# Patient Record
Sex: Female | Born: 1950 | Race: Black or African American | Hispanic: No | Marital: Single | Smoking: Current every day smoker
Health system: Southern US, Community
[De-identification: ages and names within clinical notes are randomized; demographics above are authoritative.]

## PROBLEM LIST (undated history)

## (undated) DIAGNOSIS — H409 Unspecified glaucoma: Secondary | ICD-10-CM

## (undated) DIAGNOSIS — J302 Other seasonal allergic rhinitis: Secondary | ICD-10-CM

## (undated) DIAGNOSIS — E785 Hyperlipidemia, unspecified: Secondary | ICD-10-CM

## (undated) DIAGNOSIS — E7800 Pure hypercholesterolemia, unspecified: Secondary | ICD-10-CM

## (undated) HISTORY — DX: Unspecified glaucoma: H40.9

## (undated) HISTORY — DX: Hyperlipidemia, unspecified: E78.5

## (undated) HISTORY — DX: Other seasonal allergic rhinitis: J30.2

## (undated) HISTORY — PX: TOOTH EXTRACTION: SUR596

---

## 2007-01-02 ENCOUNTER — Ambulatory Visit (HOSPITAL_COMMUNITY): Admission: RE | Admit: 2007-01-02 | Discharge: 2007-01-02 | Payer: Self-pay | Admitting: Family Medicine

## 2008-03-01 ENCOUNTER — Emergency Department (HOSPITAL_COMMUNITY): Admission: EM | Admit: 2008-03-01 | Discharge: 2008-03-01 | Payer: Self-pay | Admitting: Emergency Medicine

## 2010-12-30 ENCOUNTER — Encounter: Payer: Self-pay | Admitting: *Deleted

## 2010-12-30 ENCOUNTER — Emergency Department (HOSPITAL_COMMUNITY): Payer: BC Managed Care – PPO

## 2010-12-30 ENCOUNTER — Other Ambulatory Visit: Payer: Self-pay

## 2010-12-30 ENCOUNTER — Emergency Department (HOSPITAL_COMMUNITY)
Admission: EM | Admit: 2010-12-30 | Discharge: 2010-12-30 | Disposition: A | Discharge status: Home / Self Care | Payer: BC Managed Care – PPO | Attending: Emergency Medicine | Admitting: Emergency Medicine

## 2010-12-30 DIAGNOSIS — R51 Headache: Secondary | ICD-10-CM | POA: Insufficient documentation

## 2010-12-30 DIAGNOSIS — H8110 Benign paroxysmal vertigo, unspecified ear: Secondary | ICD-10-CM | POA: Insufficient documentation

## 2010-12-30 DIAGNOSIS — F17200 Nicotine dependence, unspecified, uncomplicated: Secondary | ICD-10-CM | POA: Insufficient documentation

## 2010-12-30 DIAGNOSIS — K029 Dental caries, unspecified: Secondary | ICD-10-CM | POA: Insufficient documentation

## 2010-12-30 DIAGNOSIS — R55 Syncope and collapse: Secondary | ICD-10-CM | POA: Insufficient documentation

## 2010-12-30 LAB — CBC
Hemoglobin: 15 g/dL (ref 12.0–15.0)
MCH: 31.7 pg (ref 26.0–34.0)
MCHC: 33.6 g/dL (ref 30.0–36.0)
RBC: 4.73 MIL/uL (ref 3.87–5.11)
RDW: 12.4 % (ref 11.5–15.5)

## 2010-12-30 LAB — COMPREHENSIVE METABOLIC PANEL
BUN: 9 mg/dL (ref 6–23)
Chloride: 104 mEq/L (ref 96–112)
Creatinine, Ser: 0.62 mg/dL (ref 0.50–1.10)
GFR calc Af Amer: >60 mL/min (ref >60)
GFR calc non Af Amer: >60 mL/min (ref >60)
Glucose, Bld: 87 mg/dL (ref 70–99)
Potassium: 3.6 mEq/L (ref 3.5–5.1)
Sodium: 137 mEq/L (ref 135–145)
Total Protein: 6.2 g/dL (ref 6.0–8.3)

## 2010-12-30 LAB — POCT I-STAT TROPONIN I: Troponin i, poc: 0 ng/mL (ref 0.00–0.08)

## 2010-12-30 MED ORDER — MECLIZINE HCL 12.5 MG PO TABS
25.0000 mg | ORAL_TABLET | Freq: Once | ORAL | Status: AC
  Administered 2010-12-30: 25 mg via ORAL
  Filled 2010-12-30: qty 2

## 2010-12-30 MED ORDER — SODIUM CHLORIDE 0.9 % IV SOLN
INTRAVENOUS | Status: DC
  Administered 2010-12-30: 20:00:00 via INTRAVENOUS

## 2010-12-30 MED ORDER — METOCLOPRAMIDE HCL 5 MG/ML IJ SOLN
10.0000 mg | Freq: Once | INTRAMUSCULAR | Status: AC
  Administered 2010-12-30: 10 mg via INTRAVENOUS
  Filled 2010-12-30: qty 2

## 2010-12-30 MED ORDER — MECLIZINE HCL 25 MG PO TABS: 25.0000 mg | ORAL_TABLET | Freq: Four times a day (QID) | ORAL | Status: AC

## 2010-12-30 MED ORDER — PROMETHAZINE HCL 25 MG PO TABS: 25.0000 mg | ORAL_TABLET | Freq: Four times a day (QID) | ORAL | Status: AC | PRN

## 2010-12-30 MED ORDER — DIPHENHYDRAMINE HCL 50 MG/ML IJ SOLN
25.0000 mg | Freq: Once | INTRAMUSCULAR | Status: AC
  Administered 2010-12-30: 25 mg via INTRAVENOUS
  Filled 2010-12-30: qty 1

## 2010-12-30 NOTE — ED Notes (Signed)
Patient states went outside to eat lunch yesterday and was eating and states she felt like the table was moving; however, her co-workers informed that she was moving.  Patient states that her legs were numb yesterday and that she woke up this morning with an all-over headache.  Patient ambulatory with steady gait, speech clear; A&O.

## 2010-12-30 NOTE — ED Provider Notes (Cosign Needed)
History     CSN: [id number] Arrival date & time: 12/30/2010  7:15 PM  Chief Complaint  Patient presents with  . Headache  . numbness in legs yesterday    Patient is a 60 y.o. female presenting with headaches.  Headache   Pt relates yesterday at work she was sitting at a table and felt like the table was spinning and her coworkers states she leaned over and passed out for a few seconds. She had difficulty walking because both legs were rubbery. Has a mild right sided headache at that time. She was sent home from work and was able to walk. States she still feels like her eyes are moving and they aren't. She has a holocranial headache today that is constant and steady, not throbbing. Denies nausea or vomiting, states she had blurred vision yesterday. Denies numbness in her extremities, denies chest pain or shortness of breath. States her headache was an 8 and is now a 5 from sitting in the waiting room so long.  States she doesn't have a hx of headaches and this has never happened before. States her grandson just left the house who was living with her to go to college and she has no one to fuss out but the dog.  History reviewed. No pertinent past medical history.  History reviewed. No pertinent past surgical history.  No family history on file. Denies hx of stroke or headaches  History  Substance Use Topics  . Smoking status: Current Everyday Smoker -- 1.0 packs/day  . Smokeless tobacco: Not on file  . Alcohol Use: Yes     occasionally  employed  OB History    Grav Para Term Preterm Abortions TAB SAB Ect Mult Living                  Review of Systems  Neurological: Positive for headaches.  All other systems reviewed and are negative.    Physical Exam  BP 122/83  Pulse 70  Temp(Src) 98.8 F (37.1 C) (Oral)  Resp 18  Ht 5\' 10"  (1.778 m)  Wt 130 lb (58.968 kg)  BMI 18.65 kg/m2  SpO2 100%  Physical Exam  Constitutional: She appears well-developed and well-nourished.    HENT:  Head: Normocephalic and atraumatic.  Mouth/Throat: Mucous membranes are normal. Abnormal dentition. Dental caries present. Posterior oropharyngeal erythema present. No oropharyngeal exudate, posterior oropharyngeal edema or tonsillar abscesses.  Eyes: EOM are normal. Pupils are equal, round, and reactive to light. No foreign bodies found. Right eye exhibits no nystagmus.  Neck: Normal range of motion.  Cardiovascular: Normal rate, regular rhythm and normal heart sounds.   Pulmonary/Chest: Effort normal and breath sounds normal.  Musculoskeletal:       Pt has no acute abnormality.   Neurological: She is alert. She has normal strength. No cranial nerve deficit or sensory deficit. She displays a negative Romberg sign.  Reflex Scores:      Patellar reflexes are 2+ on the right side and 2+ on the left side.      FTF is intact  Skin: Skin is warm and dry. No abrasion and no rash noted.  Psychiatric: She has a normal mood and affect. Her speech is normal and behavior is normal. Thought content normal.    ED Course  Procedures  MDM   Date: 09/01/20122  Rate: 65  Rhythm: normal sinus rhythm  QRS Axis: normal  Intervals: normal  ST/T Wave abnormalities: normal  Conduction Disutrbances:none  Narrative Interpretation:   Old EKG  Reviewed: none available  Results for orders placed during the hospital encounter of 12/30/10  CBC      Component Value Range   WBC 5.9  4.0 - 10.5 (K/uL)   RBC 4.73  3.87 - 5.11 (MIL/uL)   Hemoglobin 15.0  12.0 - 15.0 (g/dL)   HCT 16.1  09.6 - 04.5 (%)   MCV 94.3  78.0 - 100.0 (fL)   MCH 31.7  26.0 - 34.0 (pg)   MCHC 33.6  30.0 - 36.0 (g/dL)   RDW 40.9  81.1 - 91.4 (%)   Platelets 219  150 - 400 (K/uL)  COMPREHENSIVE METABOLIC PANEL      Component Value Range   Sodium 137  135 - 145 (mEq/L)   Potassium 3.6  3.5 - 5.1 (mEq/L)   Chloride 104  96 - 112 (mEq/L)   CO2 28  19 - 32 (mEq/L)   Glucose, Bld 87  70 - 99 (mg/dL)   BUN 9  6 - 23 (mg/dL)    Creatinine, Ser 7.82  0.50 - 1.10 (mg/dL)   Calcium 8.6  8.4 - 95.6 (mg/dL)   Total Protein 6.2  6.0 - 8.3 (g/dL)   Albumin 3.5  3.5 - 5.2 (g/dL)   AST 8  0 - 37 (U/L)   ALT 7  0 - 35 (U/L)   Alkaline Phosphatase 68  39 - 117 (U/L)   Total Bilirubin 0.4  0.3 - 1.2 (mg/dL)   GFR calc non Af Amer >60  >60 (mL/min)   GFR calc Af Amer >60  >60 (mL/min)  POCT I-STAT TROPONIN I      Component Value Range   Troponin i, poc 0.00  0.00 - 0.08 (ng/mL)   Comment 3           All normal  Ct Head Wo Contrast  12/30/2010  *RADIOLOGY REPORT*  Clinical Data: Headache, dizziness  CT HEAD WITHOUT CONTRAST  Technique:  Contiguous axial images were obtained from the base of the skull through the vertex without contrast.  Comparison: None.  Findings: No acute intracranial hemorrhage.  No focal mass lesion. No CT evidence of acute infarction.  No midline shift or mass effect.  No hydrocephalus.  Basilar cisterns are patent.  Paranasal sinuses and mastoid air cells are clear.  Orbits are normal.  IMPRESSION: No acute intracranial findings.  Original Report Authenticated By: Genevive Bi, M.D.     Pt given antivert, reglan and benadryl in the ED. States her headache is gone and she feels good ready to go home.  Ward Givens, MD 12/30/10 2232

## 2010-12-30 NOTE — ED Notes (Signed)
Pt c/o numbness in legs while at her lunch break yesterday at work. Pt states she was sitting at the table and fell over on the person beside her. Pt states she has a headache today and has never had headache before. Pt also c/o pain to teeth.

## 2012-02-01 ENCOUNTER — Other Ambulatory Visit (HOSPITAL_COMMUNITY): Payer: Self-pay | Admitting: Family Medicine

## 2012-02-01 DIAGNOSIS — Z139 Encounter for screening, unspecified: Secondary | ICD-10-CM

## 2012-02-11 ENCOUNTER — Ambulatory Visit (HOSPITAL_COMMUNITY)
Admission: RE | Admit: 2012-02-11 | Discharge: 2012-02-11 | Disposition: A | Discharge status: Home / Self Care | Payer: BC Managed Care – PPO | Source: Ambulatory Visit | Attending: Family Medicine | Admitting: Family Medicine

## 2012-02-11 DIAGNOSIS — Z139 Encounter for screening, unspecified: Secondary | ICD-10-CM

## 2012-02-11 DIAGNOSIS — Z1231 Encounter for screening mammogram for malignant neoplasm of breast: Secondary | ICD-10-CM | POA: Insufficient documentation

## 2012-02-28 ENCOUNTER — Encounter (INDEPENDENT_AMBULATORY_CARE_PROVIDER_SITE_OTHER): Payer: Self-pay | Admitting: *Deleted

## 2012-03-24 ENCOUNTER — Encounter (INDEPENDENT_AMBULATORY_CARE_PROVIDER_SITE_OTHER): Payer: Self-pay | Admitting: *Deleted

## 2012-07-10 ENCOUNTER — Telehealth: Payer: Self-pay | Admitting: Gastroenterology

## 2012-07-10 ENCOUNTER — Telehealth: Payer: Self-pay | Admitting: *Deleted

## 2012-07-10 NOTE — Telephone Encounter (Signed)
[name], Cindy Buckley was seen in the office today and has referred her sister to Dr Darrick Penna for a colonoscopy.  She would like to be triaged as opposed to having an office visit. Please call [name] on her cell phone to set up her procedure. Thank you.

## 2012-07-10 NOTE — Telephone Encounter (Signed)
Forwarding to [facility] to Triage

## 2012-07-10 NOTE — Telephone Encounter (Addendum)
Please call pt to schedule screening TCS. Asked her sister: Gardiner Ramus to ask [place] to call her to schedule. NEEDS AN APPT ON A FRI.

## 2012-07-11 NOTE — Telephone Encounter (Signed)
See separate phone note of 07/10/2012.

## 2012-07-11 NOTE — Telephone Encounter (Signed)
Called, many rings and no answer.

## 2012-07-11 NOTE — Telephone Encounter (Signed)
Called. Was told to call her after 4:00 pm one day because of her work schedule.

## 2012-07-15 ENCOUNTER — Other Ambulatory Visit: Payer: Self-pay

## 2012-07-15 DIAGNOSIS — Z1211 Encounter for screening for malignant neoplasm of colon: Secondary | ICD-10-CM

## 2012-07-16 ENCOUNTER — Telehealth: Payer: Self-pay

## 2012-07-16 NOTE — Telephone Encounter (Signed)
Gastroenterology Pre-Procedure Form    Request Date: 07/15/2012     Requesting Physician: Dr. Darrick Penna     PATIENT INFORMATION:  Cindy Buckley is a 62 y.o., female (DOB=December 10, 1950).  PROCEDURE: Procedure(s) requested: colonoscopy Procedure Reason: screening for colon cancer  PATIENT REVIEW QUESTIONS: The patient reports the following:   1. Diabetes Melitis: no 2. Joint replacements in the past 12 months: no 3. Major health problems in the past 3 months: no 4. Has an artificial valve or MVP:no 5. Has been advised in past to take antibiotics in advance of a procedure like teeth cleaning: no}    MEDICATIONS & ALLERGIES:    Patient reports the following regarding taking any blood thinners:   Plavix? no Aspirin?no Coumadin?  no  Patient confirms/reports the following medications:  Current Outpatient Prescriptions  Medication Sig Dispense Refill  . NON FORMULARY Allegra  OTC      . pravastatin (PRAVACHOL) 40 MG tablet Take 40 mg by mouth daily.       No current facility-administered medications for this visit.    Patient confirms/reports the following allergies:  No Known Allergies  Patient is appropriate to schedule for requested procedure(s): yes  AUTHORIZATION INFORMATION Primary Insurance:   ID #: Group #:  Pre-Cert / Auth required: Pre-Cert / Auth #:   Secondary Insurance:   ID #:  Group #:  Pre-Cert / Auth required: Pre-Cert / Auth #:   No orders of the defined types were placed in this encounter.    SCHEDULE INFORMATION: Procedure has been scheduled as follows:  Date: 07/25/2012   Time: 11:30 AM  Location: [facility] Short Stay  This Gastroenterology Pre-Precedure Form is being routed to the following provider(s) for review: Jonette Eva, MD

## 2012-07-16 NOTE — Telephone Encounter (Signed)
See separate triage.  

## 2012-07-22 MED ORDER — SOD PICOSULFATE-MAG OX-CIT ACD 10-3.5-12 MG-GM-GM PO PACK: 1.0000 | PACK | Freq: Once | ORAL | Status: DC

## 2012-07-22 NOTE — Telephone Encounter (Signed)
Called pharmacy and confirmed the prescription and instructions were received. Pt's sister, Tonna Corner, is aware.

## 2012-07-22 NOTE — Telephone Encounter (Signed)
Rx sent to [facility] and instructions will be faxed to [facility] since pt is scheduled for 07/25/2012.

## 2012-07-22 NOTE — Telephone Encounter (Signed)
PREPOPIK-DRINK WATER TO KEEP URINE LIGHT YELLOW.  PT SHOULD DROP OFF RX 3 DAYS PRIOR TO PROCEDURE.  

## 2012-07-24 ENCOUNTER — Encounter (HOSPITAL_COMMUNITY): Payer: Self-pay | Admitting: Pharmacy Technician

## 2012-07-24 MED ORDER — SODIUM CHLORIDE 0.45 % IV SOLN: INTRAVENOUS | Status: DC

## 2012-07-25 ENCOUNTER — Ambulatory Visit (HOSPITAL_COMMUNITY)
Admission: RE | Admit: 2012-07-25 | Discharge: 2012-07-25 | Disposition: A | Discharge status: Home Health | Payer: BC Managed Care – PPO | Source: Ambulatory Visit | Attending: Gastroenterology | Admitting: Gastroenterology

## 2012-07-25 ENCOUNTER — Encounter (HOSPITAL_COMMUNITY)
Admission: RE | Disposition: A | Discharge status: Home Health | Payer: Self-pay | Source: Ambulatory Visit | Attending: Gastroenterology

## 2012-07-25 ENCOUNTER — Encounter (HOSPITAL_COMMUNITY): Payer: Self-pay

## 2012-07-25 DIAGNOSIS — D126 Benign neoplasm of colon, unspecified: Secondary | ICD-10-CM | POA: Insufficient documentation

## 2012-07-25 DIAGNOSIS — Z1211 Encounter for screening for malignant neoplasm of colon: Secondary | ICD-10-CM | POA: Insufficient documentation

## 2012-07-25 DIAGNOSIS — K648 Other hemorrhoids: Secondary | ICD-10-CM

## 2012-07-25 HISTORY — PX: COLONOSCOPY: SHX5424

## 2012-07-25 HISTORY — DX: Pure hypercholesterolemia, unspecified: E78.00

## 2012-07-25 SURGERY — COLONOSCOPY
Anesthesia: Moderate Sedation

## 2012-07-25 MED ORDER — MEPERIDINE HCL 100 MG/ML IJ SOLN
INTRAMUSCULAR | Status: DC | PRN
  Administered 2012-07-25 (×2): 25 mg via INTRAVENOUS

## 2012-07-25 MED ORDER — MIDAZOLAM HCL 5 MG/5ML IJ SOLN
INTRAMUSCULAR | Status: DC | PRN
  Administered 2012-07-25: 1 mg via INTRAVENOUS
  Administered 2012-07-25: 2 mg via INTRAVENOUS
  Administered 2012-07-25 (×2): 1 mg via INTRAVENOUS

## 2012-07-25 MED ORDER — MEPERIDINE HCL 100 MG/ML IJ SOLN
INTRAMUSCULAR | Status: AC
  Filled 2012-07-25: qty 1

## 2012-07-25 MED ORDER — SODIUM CHLORIDE 0.9 % IV SOLN
INTRAVENOUS | Status: DC
  Administered 2012-07-25: 11:00:00 via INTRAVENOUS

## 2012-07-25 MED ORDER — STERILE WATER FOR IRRIGATION IR SOLN
Status: DC | PRN
  Administered 2012-07-25: 12:00:00

## 2012-07-25 MED ORDER — MIDAZOLAM HCL 5 MG/5ML IJ SOLN
INTRAMUSCULAR | Status: AC
  Filled 2012-07-25: qty 10

## 2012-07-25 NOTE — H&P (Signed)
  Primary Care Physician:  Isabella Stalling, MD Primary Gastroenterologist:  Dr. Darrick Penna  Pre-Procedure History & Physical: HPI:  Cindy Buckley is a 62 y.o. female here for COLON CANCER SCREENING.   Past Medical History  Diagnosis Date  . Hypercholesteremia     Past Surgical History  Procedure Laterality Date  . Tooth extraction      Prior to Admission medications   Medication Sig Start Date End Date Taking? Authorizing Provider  fexofenadine (ALLEGRA) 180 MG tablet Take 180 mg by mouth daily.   Yes Historical Provider, MD  pravastatin (PRAVACHOL) 40 MG tablet Take 40 mg by mouth daily.   Yes Historical Provider, MD  Sod Picosulfate-Mag Ox-Cit Acd 10-3.5-12 MG-GM-GM PACK Take 1 kit by mouth once. 07/22/12  Yes West Bali, MD    Allergies as of 07/15/2012  . (No Known Allergies)    History reviewed. No pertinent family history.  History   Social History  . Marital Status: Single    Spouse Name: N/A    Number of Children: N/A  . Years of Education: N/A   Occupational History  . Not on file.   Social History Main Topics  . Smoking status: Current Every Day Smoker -- 1.00 packs/day  . Smokeless tobacco: Not on file  . Alcohol Use: Yes     Comment: occasionally  . Drug Use: No  . Sexually Active: Not on file   Other Topics Concern  . Not on file   Social History Narrative  . No narrative on file    Review of Systems: See HPI, otherwise negative ROS   Physical Exam: BP 101/76  Pulse 83  Temp(Src) 98.7 F (37.1 C) (Oral)  Resp 24  Ht 5\' 10"  (1.778 m)  Wt 130 lb (58.968 kg)  BMI 18.65 kg/m2 General:   Alert,  pleasant and cooperative in NAD Head:  Normocephalic and atraumatic. Neck:  Supple; Lungs:  Clear throughout to auscultation.    Heart:  Regular rate and rhythm. Abdomen:  Soft, nontender and nondistended. Normal bowel sounds, without guarding, and without rebound.   Neurologic:  Alert and  oriented x4;  grossly normal  neurologically.  Impression/Plan:     SCREENING  Plan:  1. TCS TODAY

## 2012-07-25 NOTE — Op Note (Signed)
[facility] [address] [place], [address]   COLONOSCOPY PROCEDURE REPORT  PATIENT: Cindy Buckley, Cindy Buckley  MR#: [id number] BIRTHDATE: 1950-10-23 , 61  yrs. old GENDER: Female ENDOSCOPIST: Jonette Eva, MD REFERRED [id number] Janna Arch, M.D. PROCEDURE DATE:  07/25/2012 PROCEDURE:   Colonoscopy with cold biopsy polypectomy and Colonoscopy with snare polypectomy INDICATIONS:Average risk patient for colon cancer. MEDICATIONS: Demerol 50 mg IV and Versed 5 mg IV  DESCRIPTION OF PROCEDURE:    Physical exam was performed.  Informed consent was obtained from the patient after explaining the benefits, risks, and alternatives to procedure.  The patient was connected to monitor and placed in left lateral position. Continuous oxygen was provided by nasal cannula and IV medicine administered through an indwelling cannula.  After administration of sedation and rectal exam, the patients rectum was intubated and the EC-[id number] ([id number])  colonoscope was advanced under direct visualization to the cecum.  The scope was removed slowly by carefully examining the color, texture, anatomy, and integrity mucosa on the way out.  The patient was recovered in endoscopy and discharged home in satisfactory condition.     COLON FINDINGS: The colon was redundant.  The patient was moved on to their back to reach the cecum.  Manual abdominal counter-pressure was used to reach the cecum and Seven sessile polyps measuring 3-8 mm in size were found in the ascending colon, proximal transverse colon, descending colon(8 MM), and sigmoid colon.  A polypectomy was performed with cold forceps and using snare cautery.    OTHERWISE NORMAL COLON MODERATE INTERNAL HEMORRHOIDS.  PREP QUALITY: good. CECAL W/D TIME: 22 minutes COMPLICATIONS: None  ENDOSCOPIC IMPRESSION: 1.  Redundant SIGMOID COLON 2.  SEVEN COLON POLYPS 3.  INTERNAL HEMORRHOIDS  RECOMMENDATIONS: AWAIT BIOPSY HIGH FIBER DIET TCS WITH  OVERTUBE IN 5 YEARS IF MULTIPLE ADENOMAS AND 10 YEARS IF HYPERPLASTIC POLYPS/1 SIMPLE ADENOMA       _______________________________ Rosalie DoctorJonette Eva, MD 07/25/2012 12:59 PM     PATIENT NAME:  Cindy Buckley, Cindy Buckley MR#: [id number]

## 2012-07-28 ENCOUNTER — Encounter (HOSPITAL_COMMUNITY): Payer: Self-pay | Admitting: Gastroenterology

## 2012-08-05 ENCOUNTER — Telehealth: Payer: Self-pay | Admitting: Gastroenterology

## 2012-08-05 NOTE — Telephone Encounter (Signed)
MADE 10 YEAR TCS REMINDER

## 2012-08-05 NOTE — Telephone Encounter (Signed)
Please call pt. She had 2 simple adenomas removed from her colon.   FOLLOW A HIGH FIBER DIET. AVOID ITEMS THAT CAUSE BLOATING AND GAS.   USE PREPARATION H AS NEEDED FOR RECTAL PAIN OR BLEEDING.   Next colonoscopy in 10 years.

## 2012-08-05 NOTE — Telephone Encounter (Signed)
Cc PCP 

## 2012-08-05 NOTE — Telephone Encounter (Signed)
Pt aware of results 

## 2012-08-05 NOTE — Telephone Encounter (Signed)
LM for pt to call for results.  

## 2012-10-02 ENCOUNTER — Encounter (HOSPITAL_COMMUNITY): Payer: Self-pay | Admitting: Emergency Medicine

## 2012-10-02 ENCOUNTER — Emergency Department (HOSPITAL_COMMUNITY)
Admission: EM | Admit: 2012-10-02 | Discharge: 2012-10-02 | Disposition: A | Discharge status: Home / Self Care | Payer: BC Managed Care – PPO | Attending: Emergency Medicine | Admitting: Emergency Medicine

## 2012-10-02 ENCOUNTER — Emergency Department (HOSPITAL_COMMUNITY): Payer: BC Managed Care – PPO

## 2012-10-02 DIAGNOSIS — Y9389 Activity, other specified: Secondary | ICD-10-CM | POA: Insufficient documentation

## 2012-10-02 DIAGNOSIS — Y990 Civilian activity done for income or pay: Secondary | ICD-10-CM | POA: Insufficient documentation

## 2012-10-02 DIAGNOSIS — R059 Cough, unspecified: Secondary | ICD-10-CM | POA: Insufficient documentation

## 2012-10-02 DIAGNOSIS — R61 Generalized hyperhidrosis: Secondary | ICD-10-CM | POA: Insufficient documentation

## 2012-10-02 DIAGNOSIS — T675XXA Heat exhaustion, unspecified, initial encounter: Secondary | ICD-10-CM | POA: Insufficient documentation

## 2012-10-02 DIAGNOSIS — Y9289 Other specified places as the place of occurrence of the external cause: Secondary | ICD-10-CM | POA: Insufficient documentation

## 2012-10-02 DIAGNOSIS — E7800 Pure hypercholesterolemia, unspecified: Secondary | ICD-10-CM | POA: Insufficient documentation

## 2012-10-02 DIAGNOSIS — X30XXXA Exposure to excessive natural heat, initial encounter: Secondary | ICD-10-CM | POA: Insufficient documentation

## 2012-10-02 DIAGNOSIS — F17200 Nicotine dependence, unspecified, uncomplicated: Secondary | ICD-10-CM | POA: Insufficient documentation

## 2012-10-02 DIAGNOSIS — Z79899 Other long term (current) drug therapy: Secondary | ICD-10-CM | POA: Insufficient documentation

## 2012-10-02 DIAGNOSIS — J3489 Other specified disorders of nose and nasal sinuses: Secondary | ICD-10-CM | POA: Insufficient documentation

## 2012-10-02 DIAGNOSIS — R55 Syncope and collapse: Secondary | ICD-10-CM | POA: Insufficient documentation

## 2012-10-02 DIAGNOSIS — J209 Acute bronchitis, unspecified: Secondary | ICD-10-CM | POA: Insufficient documentation

## 2012-10-02 DIAGNOSIS — R05 Cough: Secondary | ICD-10-CM | POA: Insufficient documentation

## 2012-10-02 DIAGNOSIS — T679XXA Effect of heat and light, unspecified, initial encounter: Secondary | ICD-10-CM

## 2012-10-02 LAB — CBC WITH DIFFERENTIAL/PLATELET
Basophils Absolute: 0 10*3/uL (ref 0.0–0.1)
Basophils Relative: 0 % (ref 0–1)
Eosinophils Absolute: 0.1 10*3/uL (ref 0.0–0.7)
Eosinophils Relative: 3 % (ref 0–5)
HCT: 42.3 % (ref 36.0–46.0)
MCH: 32.1 pg (ref 26.0–34.0)
MCHC: 34 g/dL (ref 30.0–36.0)
MCV: 94.2 fL (ref 78.0–100.0)
Monocytes Absolute: 0.4 10*3/uL (ref 0.1–1.0)
Platelets: 256 10*3/uL (ref 150–400)
RDW: 12.2 % (ref 11.5–15.5)

## 2012-10-02 LAB — POCT I-STAT, CHEM 8
BUN: 14 mg/dL (ref 6–23)
Calcium, Ion: 1.22 mmol/L (ref 1.12–1.23)
HCT: 42 % (ref 36.0–46.0)
Hemoglobin: 14.3 g/dL (ref 12.0–15.0)
Sodium: 141 mEq/L (ref 135–145)
TCO2: 28 mmol/L (ref 0–100)

## 2012-10-02 MED ORDER — ALBUTEROL SULFATE HFA 108 (90 BASE) MCG/ACT IN AERS: 2.0000 | INHALATION_SPRAY | RESPIRATORY_TRACT | Status: AC | PRN

## 2012-10-02 MED ORDER — IPRATROPIUM BROMIDE 0.02 % IN SOLN
0.5000 mg | Freq: Once | RESPIRATORY_TRACT | Status: AC
  Administered 2012-10-02: 0.5 mg via RESPIRATORY_TRACT
  Filled 2012-10-02: qty 2.5

## 2012-10-02 MED ORDER — AMOXICILLIN 500 MG PO CAPS: 500.0000 mg | ORAL_CAPSULE | Freq: Three times a day (TID) | ORAL | Status: DC

## 2012-10-02 MED ORDER — AEROCHAMBER Z-STAT PLUS/MEDIUM MISC: 1.0000 | Freq: Once | Status: DC

## 2012-10-02 MED ORDER — ALBUTEROL SULFATE HFA 108 (90 BASE) MCG/ACT IN AERS
INHALATION_SPRAY | RESPIRATORY_TRACT | Status: AC
  Filled 2012-10-02: qty 6.7

## 2012-10-02 MED ORDER — AEROCHAMBER PLUS FLO-VU MEDIUM MISC: 1.0000 | Freq: Once | Status: DC

## 2012-10-02 MED ORDER — ALBUTEROL SULFATE (5 MG/ML) 0.5% IN NEBU
5.0000 mg | INHALATION_SOLUTION | Freq: Once | RESPIRATORY_TRACT | Status: AC
  Administered 2012-10-02: 5 mg via RESPIRATORY_TRACT
  Filled 2012-10-02: qty 1

## 2012-10-02 NOTE — ED Notes (Signed)
Awaiting respiratory for inhaler teaching.

## 2012-10-02 NOTE — ED Notes (Signed)
Patient sent here from work for episode of dizziness. Per patient was working yesterday in heat, got dizzy went home went to sleep and felt fine. Patient reports having another episode of dizziness this morning that has now subsided. Per patient no LOC or weakness.

## 2012-10-02 NOTE — ED Provider Notes (Signed)
History  This chart was scribed for Ward Givens, MD by Bennett Scrape, ED Scribe. This patient was seen in room APA04/APA04 and the patient's care was started at 9:26 AM.  CSN: [id number]  Arrival date & time 10/02/12  0905   First MD Initiated Contact with Patient 10/02/12 [phone]      Chief Complaint  Patient presents with  . Dizziness     The history is provided by the patient. No language interpreter was used.   HPI Comments: Cindy Buckley is a 62 y.o. female who presents to the Emergency Department complaining of two episodes of intermittent dizziness spells with associated lightheadedness and diaphoresis that both occurred at work. Pt reports that she had one episode yesterday while at work but went home and the symptoms resolved after a nap. States it was hot yesterday at work and when she came home she was "drained" and she immediately laid down and slept for 2 hrs then felt better. She states that she felt the same dizziness about 20 mintues  after clocking in to work today. 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 Pt reports that co-workers stated that she had grabbed a pole and was "rocking". She denies remembering this but reports that she felt diaphoretic at the time. Pt states that she went and sat down in a cool room and had the nurse check her out. Cold rags were applied to the back of her neck and back and she was given something to drink with resolvement. She denies LOC. Pt states that the factory was warmer than normal over the past 2 days.  She denies any recent changes stating that she has worked for the same company for the past 41 years. She admits that she normally drinks water and [facility] during her shifts. Pt denies having prior episodes of similar symptoms.  She also reports 3 weeks of cough productive of white sputum and nasal congestion described as white. She denies SOB with exertion. She denies fevers, sore throat, SOB, CP, sneezing, wheezing and  HA as associated symptoms. Pt is a current 1.0 ppd everyday smoker and occasional alcohol user. Pt is currently only taking allegra.  PCP is Dr. Delbert Harness  Past Medical History  Diagnosis Date  . Hypercholesteremia     Past Surgical History  Procedure Laterality Date  . Tooth extraction    . Colonoscopy N/A 07/25/2012    Procedure: COLONOSCOPY;  Surgeon: West Bali, MD;  Location: [facility]O [facility];  Service: Endoscopy;  Laterality: N/A;  11:30 AM    History reviewed. No pertinent family history.  History  Substance Use Topics  . Smoking status: Current Every Day Smoker -- 1.00 packs/day for 32 years    Types: Cigarettes  . Smokeless tobacco: Never Used  . Alcohol Use: Yes     Comment: occasionally  employed   OB History   Grav Para Term Preterm Abortions TAB SAB Ect Mult Living   3 2 2  1  1   2       Review of Systems  HENT: Positive for congestion. Negative for sore throat and sneezing.   Respiratory: Positive for cough. Negative for shortness of breath.   Cardiovascular: Negative for chest pain.  Gastrointestinal: Negative for nausea and vomiting.  Neurological: Positive for dizziness and light-headedness. Negative for syncope.  All other systems reviewed and are negative.    Allergies  Review of patient's allergies indicates no known allergies.  Home Medications  Current Outpatient Rx  Name  Route  Sig  Dispense  Refill  . fexofenadine (ALLEGRA) 180 MG tablet   Oral   Take 180 mg by mouth daily.         . pravastatin (PRAVACHOL) 40 MG tablet   Oral   Take 40 mg by mouth daily.           Triage Vitals: BP 120/79  Pulse 94  Temp(Src) 98.7 F (37.1 C) (Oral)  Resp 16  Ht 5\' 10"  (1.778 m)  Wt 152 lb (68.947 kg)  BMI 21.81 kg/m2  SpO2 97%  Vital signs normal    Physical Exam  Nursing note and vitals reviewed. Constitutional: She is oriented to person, place, and time. She appears well-developed and well-nourished.  Non-toxic appearance.  She does not appear ill. No distress.  HENT:  Head: Normocephalic and atraumatic.  Right Ear: External ear normal.  Left Ear: External ear normal.  Nose: Nose normal. No mucosal edema or rhinorrhea.  Mouth/Throat: Mucous membranes are normal. No dental abscesses or edematous.  Dry tongue, edentionless   Eyes: Conjunctivae and EOM are normal. Pupils are equal, round, and reactive to light.  Neck: Normal range of motion and full passive range of motion without pain. Neck supple.  Cardiovascular: Normal rate, regular rhythm and normal heart sounds.  Exam reveals no gallop and no friction rub.   No murmur heard. Pulmonary/Chest: Effort normal. No respiratory distress. She has no wheezes. She has no rhonchi. She has no rales. She exhibits no tenderness and no crepitus.  diminished breath sounds diffusely   Abdominal: Soft. Normal appearance and bowel sounds are normal. She exhibits no distension. There is no tenderness. There is no rebound and no guarding.  Musculoskeletal: Normal range of motion. She exhibits no edema and no tenderness.  Moves all extremities well.   Neurological: She is alert and oriented to person, place, and time. She has normal strength. No cranial nerve deficit.  Skin: Skin is warm, dry and intact. No rash noted. No erythema. No pallor.  Psychiatric: She has a normal mood and affect. Her speech is normal and behavior is normal. Her mood appears not anxious.    ED Course  Procedures (including critical care time)  Medications  albuterol (PROVENTIL) (5 MG/ML) 0.5% nebulizer solution 5 mg (5 mg Nebulization Given 10/02/12 1022)  ipratropium (ATROVENT) nebulizer solution 0.5 mg (0.5 mg Nebulization Given 10/02/12 1022)  pt given spacer  DIAGNOSTIC STUDIES: Oxygen Saturation is 97% on room air, normal by my interpretation.    COORDINATION OF CARE: 10:03 AM-Advised pt that she Discussed treatment plan which includes medications, CXR, CBC panel and I-stat with pt at bedside  and pt agreed to plan.   11:34 AM-Pt rechecked and reports improvement with breathing treatment. Upon re-exam, pt is breathing deeper with a few rhonchi noted. Informed pt of radiology and lab work results. Discussed discharge plan which includes returning to work with strict precautions to drink more fluids with pt and pt agreed to plan. Also advised pt to follow up as needed and pt agreed. Addressed symptoms to return for with pt.   Results for orders placed during the hospital encounter of 10/02/12  CBC WITH DIFFERENTIAL      Result Value Range   WBC 5.2  4.0 - 10.5 K/uL   RBC 4.49  3.87 - 5.11 MIL/uL   Hemoglobin 14.4  12.0 - 15.0 g/dL   HCT 16.1  09.6 - 04.5 %   MCV 94.2  78.0 - 100.0 fL   MCH 32.1  26.0 - 34.0 pg   MCHC 34.0  30.0 - 36.0 g/dL   RDW 09.8  11.9 - 14.7 %   Platelets 256  150 - 400 K/uL   Neutrophils Relative % 33 (*) 43 - 77 %   Neutro Abs 1.7  1.7 - 7.7 K/uL   Lymphocytes Relative 56 (*) 12 - 46 %   Lymphs Abs 2.9  0.7 - 4.0 K/uL   Monocytes Relative 7  3 - 12 %   Monocytes Absolute 0.4  0.1 - 1.0 K/uL   Eosinophils Relative 3  0 - 5 %   Eosinophils Absolute 0.1  0.0 - 0.7 K/uL   Basophils Relative 0  0 - 1 %   Basophils Absolute 0.0  0.0 - 0.1 K/uL  POCT I-STAT, CHEM 8      Result Value Range   Sodium 141  135 - 145 mEq/L   Potassium 3.5  3.5 - 5.1 mEq/L   Chloride 102  96 - 112 mEq/L   BUN 14  6 - 23 mg/dL   Creatinine, Ser 8.29  0.50 - 1.10 mg/dL   Glucose, Bld 78  70 - 99 mg/dL   Calcium, Ion 5.62  1.30 - 1.23 mmol/L   TCO2 28  0 - 100 mmol/L   Hemoglobin 14.3  12.0 - 15.0 g/dL   HCT 86.5  78.4 - 69.6 %   Laboratory interpretation all normal   Dg Chest 2 View  10/02/2012   *RADIOLOGY REPORT*  Clinical Data: Cough.  CHEST - 2 VIEW  Comparison: None  Findings: The cardiac silhouette, mediastinal and hilar contours are within normal limits.  There is tortuosity of the thoracic aorta.  The lungs demonstrate hyperinflation and emphysematous changes.  No  acute overlying pulmonary process.  No pleural effusion.  The bony thorax is intact.  IMPRESSION: Emphysematous changes but no acute pulmonary findings.   Original Report Authenticated By: Rudie Meyer, M.D.    Date: 10/02/2012  Rate: 90  Rhythm: normal sinus rhythm  QRS Axis: normal  Intervals: normal  ST/T Wave abnormalities: nonspecific T wave changes  Conduction Disutrbances:none  Narrative Interpretation:   Old EKG Reviewed: none available     1. Near syncope   2. Heat exposure   3. Bronchitis, acute, with bronchospasm       Discharge Medication List as of 10/02/2012 11:40 AM    START taking these medications   Details  albuterol (PROVENTIL HFA;VENTOLIN HFA) 108 (90 BASE) MCG/ACT inhaler Inhale 2 puffs into the lungs every 4 (four) hours as needed for wheezing., Starting 10/02/2012, Until Discontinued, Print    amoxicillin (AMOXIL) 500 MG capsule Take 1 capsule (500 mg total) by mouth 3 (three) times daily., Starting 10/02/2012, Until Discontinued, Print        Plan discharge   Devoria Albe, MD, FACEP    MDM   I personally performed the services described in this documentation, which was scribed in my presence. The recorded information has been reviewed and considered.  Devoria Albe, MD, Armando Gang       Ward Givens, MD 10/02/12 [phone]

## 2013-02-16 ENCOUNTER — Other Ambulatory Visit (HOSPITAL_COMMUNITY): Payer: Self-pay | Admitting: Family Medicine

## 2013-02-16 DIAGNOSIS — Z139 Encounter for screening, unspecified: Secondary | ICD-10-CM

## 2013-03-02 ENCOUNTER — Ambulatory Visit (HOSPITAL_COMMUNITY)
Admission: RE | Admit: 2013-03-02 | Discharge: 2013-03-02 | Disposition: A | Discharge status: Home / Self Care | Payer: BC Managed Care – PPO | Source: Ambulatory Visit | Attending: Family Medicine | Admitting: Family Medicine

## 2013-03-02 DIAGNOSIS — Z139 Encounter for screening, unspecified: Secondary | ICD-10-CM

## 2013-03-02 DIAGNOSIS — Z1231 Encounter for screening mammogram for malignant neoplasm of breast: Secondary | ICD-10-CM | POA: Insufficient documentation

## 2014-01-21 ENCOUNTER — Other Ambulatory Visit (HOSPITAL_COMMUNITY): Payer: Self-pay | Admitting: Family Medicine

## 2014-01-21 DIAGNOSIS — Z1231 Encounter for screening mammogram for malignant neoplasm of breast: Secondary | ICD-10-CM

## 2014-03-01 ENCOUNTER — Encounter (HOSPITAL_COMMUNITY): Payer: Self-pay | Admitting: Emergency Medicine

## 2014-03-08 ENCOUNTER — Ambulatory Visit (HOSPITAL_COMMUNITY)
Admission: RE | Admit: 2014-03-08 | Discharge: 2014-03-08 | Disposition: A | Discharge status: Home / Self Care | Payer: BC Managed Care – PPO | Source: Ambulatory Visit | Attending: Family Medicine | Admitting: Family Medicine

## 2014-03-08 DIAGNOSIS — Z1231 Encounter for screening mammogram for malignant neoplasm of breast: Secondary | ICD-10-CM | POA: Diagnosis present

## 2015-06-27 ENCOUNTER — Other Ambulatory Visit (HOSPITAL_COMMUNITY): Payer: Self-pay | Admitting: Family Medicine

## 2015-06-27 DIAGNOSIS — Z1231 Encounter for screening mammogram for malignant neoplasm of breast: Secondary | ICD-10-CM

## 2015-07-01 ENCOUNTER — Ambulatory Visit (HOSPITAL_COMMUNITY)
Admission: RE | Admit: 2015-07-01 | Discharge: 2015-07-01 | Disposition: A | Discharge status: Home / Self Care | Payer: BLUE CROSS/BLUE SHIELD | Source: Ambulatory Visit | Attending: Family Medicine | Admitting: Family Medicine

## 2015-07-01 DIAGNOSIS — Z1231 Encounter for screening mammogram for malignant neoplasm of breast: Secondary | ICD-10-CM | POA: Diagnosis present

## 2017-01-22 ENCOUNTER — Other Ambulatory Visit (HOSPITAL_COMMUNITY): Payer: Self-pay | Admitting: Family Medicine

## 2017-01-22 DIAGNOSIS — Z1231 Encounter for screening mammogram for malignant neoplasm of breast: Secondary | ICD-10-CM

## 2017-02-01 ENCOUNTER — Ambulatory Visit (HOSPITAL_COMMUNITY)
Admission: RE | Admit: 2017-02-01 | Discharge: 2017-02-01 | Disposition: A | Discharge status: Home / Self Care | Payer: BLUE CROSS/BLUE SHIELD | Source: Ambulatory Visit | Attending: Family Medicine | Admitting: Family Medicine

## 2017-02-01 DIAGNOSIS — Z1231 Encounter for screening mammogram for malignant neoplasm of breast: Secondary | ICD-10-CM | POA: Diagnosis not present

## 2018-03-07 ENCOUNTER — Other Ambulatory Visit (HOSPITAL_COMMUNITY): Payer: Self-pay | Admitting: Family Medicine

## 2018-03-07 DIAGNOSIS — Z1231 Encounter for screening mammogram for malignant neoplasm of breast: Secondary | ICD-10-CM

## 2018-03-13 ENCOUNTER — Ambulatory Visit (HOSPITAL_COMMUNITY)
Admission: RE | Admit: 2018-03-13 | Discharge: 2018-03-13 | Disposition: A | Discharge status: Home / Self Care | Payer: Medicare HMO | Source: Ambulatory Visit | Attending: Family Medicine | Admitting: Family Medicine

## 2018-03-13 DIAGNOSIS — Z1231 Encounter for screening mammogram for malignant neoplasm of breast: Secondary | ICD-10-CM | POA: Insufficient documentation

## 2018-03-13 MED ORDER — ALBUMIN HUMAN 25 % IV SOLN
INTRAVENOUS | Status: AC
  Filled 2018-03-13: qty 200

## 2019-02-12 ENCOUNTER — Other Ambulatory Visit (HOSPITAL_COMMUNITY): Payer: Self-pay | Admitting: Family Medicine

## 2019-02-12 DIAGNOSIS — Z1231 Encounter for screening mammogram for malignant neoplasm of breast: Secondary | ICD-10-CM

## 2019-03-18 ENCOUNTER — Ambulatory Visit (HOSPITAL_COMMUNITY)
Admission: RE | Admit: 2019-03-18 | Discharge: 2019-03-18 | Disposition: A | Discharge status: Home / Self Care | Payer: Medicare HMO | Source: Ambulatory Visit | Attending: Family Medicine | Admitting: Family Medicine

## 2019-03-18 ENCOUNTER — Other Ambulatory Visit: Payer: Self-pay

## 2019-03-18 DIAGNOSIS — Z1231 Encounter for screening mammogram for malignant neoplasm of breast: Secondary | ICD-10-CM

## 2019-06-26 ENCOUNTER — Ambulatory Visit: Payer: Medicare HMO | Attending: Internal Medicine

## 2019-06-26 DIAGNOSIS — Z23 Encounter for immunization: Secondary | ICD-10-CM | POA: Insufficient documentation

## 2019-06-26 NOTE — Progress Notes (Signed)
   Covid-19 Vaccination Clinic  Name:  Cindy Buckley    MRN: [id number] DOB: 01-Dec-1950  06/26/2019  [name] was observed post Covid-19 immunization for 15 minutes without incidence. She was provided with Vaccine Information Sheet and instruction to access the V-Safe system.   [name] was instructed to call 911 with any severe reactions post vaccine: Marland Kitchen Difficulty breathing  . Swelling of your face and throat  . A fast heartbeat  . A bad rash all over your body  . Dizziness and weakness    Immunizations Administered    Name Date Dose VIS Date Route   Pfizer COVID-19 Vaccine 06/26/2019 11:15 AM 0.3 mL 04/10/2019 Intramuscular   Manufacturer: [place]   Lot: [id number]   [place]: [id number]

## 2019-07-21 ENCOUNTER — Ambulatory Visit: Payer: Medicare HMO | Attending: Internal Medicine

## 2019-07-21 DIAGNOSIS — Z23 Encounter for immunization: Secondary | ICD-10-CM

## 2019-07-21 NOTE — Progress Notes (Signed)
   Covid-19 Vaccination Clinic  Name:  Cindy Buckley    MRN: [id number] DOB: 08-23-1950  07/21/2019  [name] was observed post Covid-19 immunization for 15 minutes without incident. She was provided with Vaccine Information Sheet and instruction to access the V-Safe system.   [name] was instructed to call 911 with any severe reactions post vaccine: Marland Kitchen Difficulty breathing  . Swelling of face and throat  . A fast heartbeat  . A bad rash all over body  . Dizziness and weakness   Immunizations Administered    Name Date Dose VIS Date Route   Pfizer COVID-19 Vaccine 07/21/2019 12:22 PM 0.3 mL 04/10/2019 Intramuscular   Manufacturer: [employer]   Lot: [id number]   [place]: [id number]

## 2020-02-08 ENCOUNTER — Other Ambulatory Visit (HOSPITAL_COMMUNITY): Payer: Self-pay | Admitting: Family Medicine

## 2020-02-08 DIAGNOSIS — Z1231 Encounter for screening mammogram for malignant neoplasm of breast: Secondary | ICD-10-CM

## 2020-02-09 ENCOUNTER — Other Ambulatory Visit (HOSPITAL_COMMUNITY): Payer: Self-pay | Admitting: Family Medicine

## 2020-02-09 DIAGNOSIS — Z780 Asymptomatic menopausal state: Secondary | ICD-10-CM

## 2020-04-18 ENCOUNTER — Ambulatory Visit (HOSPITAL_COMMUNITY)
Admission: RE | Admit: 2020-04-18 | Discharge: 2020-04-18 | Disposition: A | Discharge status: Home / Self Care | Payer: Medicare HMO | Source: Ambulatory Visit | Attending: Family Medicine | Admitting: Family Medicine

## 2020-04-18 ENCOUNTER — Other Ambulatory Visit: Payer: Self-pay

## 2020-04-18 DIAGNOSIS — Z1231 Encounter for screening mammogram for malignant neoplasm of breast: Secondary | ICD-10-CM | POA: Insufficient documentation

## 2020-06-04 ENCOUNTER — Encounter (INDEPENDENT_AMBULATORY_CARE_PROVIDER_SITE_OTHER): Payer: Self-pay | Admitting: Family

## 2020-06-04 ENCOUNTER — Ambulatory Visit (INDEPENDENT_AMBULATORY_CARE_PROVIDER_SITE_OTHER): Payer: Medicare Other | Admitting: Family

## 2020-06-04 VITALS — BP 118/73 | HR 76 | Temp 98.2°F | Resp 16 | Ht 71.0 in | Wt 200.4 lb

## 2020-06-04 DIAGNOSIS — M79662 Pain in left lower leg: Secondary | ICD-10-CM

## 2020-06-04 MED ORDER — CYCLOBENZAPRINE HCL 10 MG PO TABS
10.0000 mg | ORAL_TABLET | Freq: Every evening | ORAL | 0 refills | Status: AC
Start: 2020-06-04 — End: 2020-08-03

## 2020-06-04 MED ORDER — IBUPROFEN 600 MG PO TABS
600.0000 mg | ORAL_TABLET | Freq: Three times a day (TID) | ORAL | 0 refills | Status: AC | PRN
Start: 2020-06-04 — End: ?

## 2020-06-04 NOTE — Patient Instructions (Signed)
Leg Cramps  A muscle cramp or spasm is a strong contraction of the muscle fibers. It is also called a charley horse. This may occur in the foot, calf, or thigh at night when the legs are elevated. If the spasm is prolonged, it can become very painful. This may be caused by sleeping in an uncomfortable position, muscle fatigue, poor muscle tone from lack of exercise and stretching, dehydration, electrolyte imbalance, diabetes, alcohol use, and certain medicine.  Home care   Drink plenty of fluids during the day to prevent dehydration.   Stretch your legs before bedtime.   Eat a diet high in potassium. These foods include fresh fruit, such as bananas, oranges, cantaloupe, and honeydew melon. It also includes apple, prune, orange, grape and pineapple juices. Other foods high in potassium are white, red, and pinto beans, baked potatoes, raw spinach, cod, flounder, halibut, salmon, and scallops.   Talk with your healthcare provider about taking mineral and vitamin supplements that contain magnesium and vitamin B-12 if you are not already taking these. Other prescription medicines may also be used.   Stay away from stimulants such as caffeine, nicotine, and decongestants.  How to relieve an acute leg cramp   For mild pain, getting out of the bed and walking may help. Some people find relief with heat and massage. You can apply heat with a warm shower, bath, or compress. Some people feel better with a cold packs. You can make an ice pack by filling a plastic bag that seals at the top with ice cubes and then wrapping it with a thin towel. Try both and use the method that feels best for 15 to 20 minutes at a time.   For severe pain, stretching the muscle that is in spasm may quickly relieve the pain.   When the spasm is in your foot, your toes may curl up or down. To stretch the muscle in spasm, bend your toes in the opposite direction. If the spasm pulls your toes up, bend them down. If the spasm pulls them down,  bend them up.   When the spasm is in your calf, bend the ankle so the foot points upward toward your knee.   When the spasm is in your thigh, bend or straighten the knee and hip until you feel relief.  Follow-up care  Follow up with your healthcare provider, or as advised.  When to seek medical advice  Call your healthcare provider right away if any of these occur:   Walking makes your pain worse and rest makes it better   You develop weakness in the affected leg   Pain or frequency of spasms increases and is not controlled by the above measures  [employer] last reviewed this educational content on 08/28/2016   2000-2021 [employer]. All rights reserved. This information is not intended as a substitute for professional medical care. Always follow your healthcare professional's instructions.

## 2020-06-04 NOTE — Progress Notes (Signed)
[facility]  PROGRESS NOTE     Patient: Wendy Huber   Date: 06/04/2020   MRN: [id number]       Brieanne Mignone is a 70 y.o. female      HISTORY     History obtained from: Patient    Chief Complaint   Patient presents with    Leg Pain     left posterior popliteal space has pain. Started on 2/2. No recall of injury.        Patient presents with c/o left calf pain and tenderness on weightbearing for the last one week duration. Reports a pain at the apoplectica fossa that radiates down the calf. Warm at the left knee, but symmetric calves of the two legs. No history of injury of trauma. Past history includes Glaucoma and Hyperlipidemia. Currently taking ASA 81 mg po daily.       Leg Pain   The incident occurred 5 to 7 days ago. The incident occurred at home. There was no injury mechanism. The pain is present in the left leg. The pain has been constant since onset. Pertinent negatives include no inability to bear weight, loss of sensation, muscle weakness, numbness or tingling. Associated symptoms comments: Painful weight bearing. The symptoms are aggravated by weight bearing.        Review of Systems   Constitutional: Negative.  Negative for diaphoresis and fatigue.   HENT: Negative.    Respiratory: Negative for cough, chest tightness, shortness of breath and stridor.    Cardiovascular: Negative.    Gastrointestinal: Negative.    Genitourinary: Negative.    Musculoskeletal:        Left calf pain   Neurological: Negative.  Negative for tingling and numbness.   Hematological: Negative.    All other systems reviewed and are negative.      History:    Pertinent Past Medical, Surgical, Family and Social History were reviewed.        Current Outpatient Medications:     dorzolamide-timolol (COSOPT) 22.3-6.8 MG/ML ophthalmic solution, INSTILL 1 DROP INTO LEFT EYE IN THE MORNING AND AT DINNERTIME, Disp: , Rfl:     fexofenadine (ALLEGRA) 180 MG tablet, Take 180 mg by mouth daily, Disp: , Rfl:     fluticasone (FLONASE) 50  MCG/ACT nasal spray, , Disp: , Rfl:     latanoprost (XALATAN) 0.005 % ophthalmic solution, , Disp: , Rfl:     methIMAzole (TAPAZOLE) 5 MG tablet, , Disp: , Rfl:     pravastatin (PRAVACHOL) 40 MG tablet, TAKE 1 TABLET BY MOUTH ONCE DAILY FOR CHOLESTEROL, Disp: , Rfl:     torsemide (DEMADEX) 5 MG tablet, TAKE 1 TABLET BY MOUTH ONCE DAILY FOR SWELLING, Disp: , Rfl:     No Known Allergies    Medications and Allergies reviewed.    PHYSICAL EXAM     Vitals:    06/04/20 1221   BP: 118/73   Pulse: 76   Resp: 16   Temp: 98.2 F (36.8 C)   TempSrc: Tympanic   SpO2: 97%   Weight: 90.9 kg (200 lb 6.4 oz)   Height: 1.803 m (5\' 11" )       Physical Exam  Constitutional:       General: She is not in acute distress.     Appearance: Normal appearance. She is not ill-appearing, toxic-appearing or diaphoretic.   HENT:      Head: Normocephalic and atraumatic.      Nose: Nose normal.   Cardiovascular:  Rate and Rhythm: Normal rate and regular rhythm.      Pulses: Normal pulses.      Heart sounds: Normal heart sounds.   Pulmonary:      Effort: Pulmonary effort is normal. No respiratory distress.      Breath sounds: Normal breath sounds. No stridor. No wheezing, rhonchi or rales.   Abdominal:      General: Abdomen is flat.   Musculoskeletal:         General: Normal range of motion.      Cervical back: Normal range of motion and neck supple.      Comments: Tenderness over the left apoplecteal fossa radiating to the calf.Bilateral legs are symmetrical non warm left leg, no open wound, and no skin color change    Neurological:      Mental Status: She is alert.   Skin:     General: Skin is warm and dry.   Chest:      Chest wall: No tenderness.   Vitals and nursing note reviewed.         UCC COURSE     There were no labs reviewed with this patient during the visit.      There were no x-rays reviewed with this patient during the visit.    No current facility-administered medications for this visit.       PROCEDURES      Procedures    MEDICAL DECISION MAKING     History, physical, labs/studies most consistent with Left calf cramp pain as the diagnosis.    Chart Review:  Prior PCP, Specialist and/or ED notes reviewed today: Yes  Prior labs/images/studies reviewed today: Not Applicable    Differential Diagnosis: left calf pain of unknown cause       ASSESSMENT     Encounter Diagnosis   Name Primary?    Right calf pain Yes            PLAN      - Offered to be referred to Emergency room for possible ultrasound of the left calf to r/o DVT, patient deferred   Given antipain and muscle relaxant medications and   -Provided strict instruction to go to emergency room with worsening symptoms.       No orders of the defined types were placed in this encounter.    Requested Prescriptions      No prescriptions requested or ordered in this encounter       Discussed results and diagnosis with patient/family.  Reviewed warning signs for worsening condition, as well as, indications for follow-up with primary care physician and return to urgent care clinic.   Patient/family expressed understanding of instructions.     An After Visit Summary was provided to the patient.

## 2020-08-12 ENCOUNTER — Other Ambulatory Visit: Payer: Self-pay

## 2020-08-12 ENCOUNTER — Ambulatory Visit (HOSPITAL_COMMUNITY)
Admission: RE | Admit: 2020-08-12 | Discharge: 2020-08-12 | Disposition: A | Discharge status: Home / Self Care | Payer: Medicare HMO | Source: Ambulatory Visit | Attending: Family Medicine | Admitting: Family Medicine

## 2020-08-12 DIAGNOSIS — Z780 Asymptomatic menopausal state: Secondary | ICD-10-CM | POA: Diagnosis present

## 2021-03-12 IMAGING — MG DIGITAL SCREENING BILAT W/ TOMO W/ CAD
8 series · 8 of 24 positions shown · non-contrast
Comparison: Previous exam(s).

CLINICAL DATA: Screening.

EXAM:
DIGITAL SCREENING BILATERAL MAMMOGRAM WITH TOMO AND CAD

[R CC synth-2D]
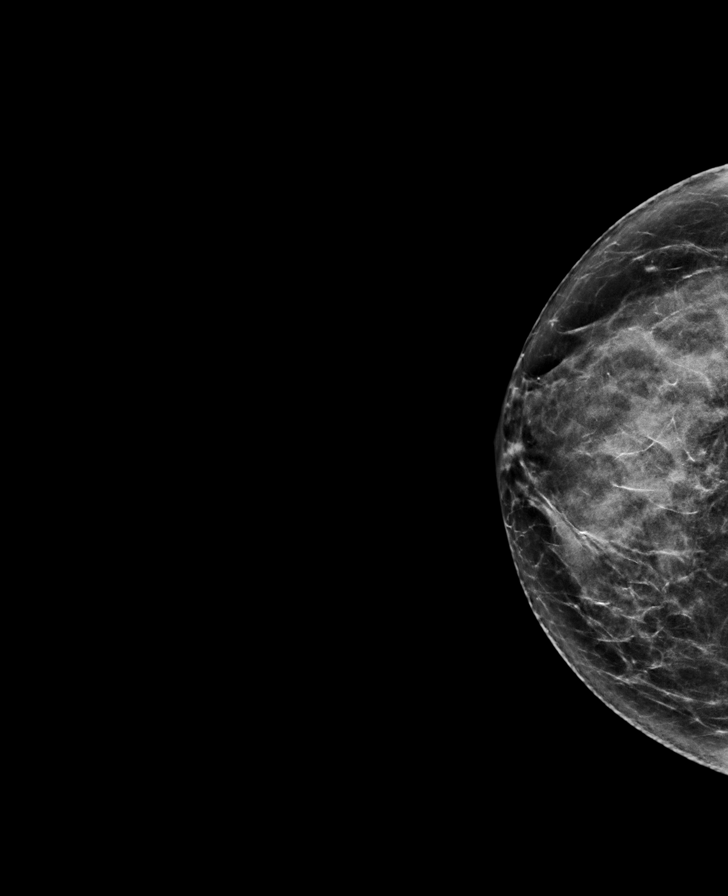

[R MLO synth-2D]
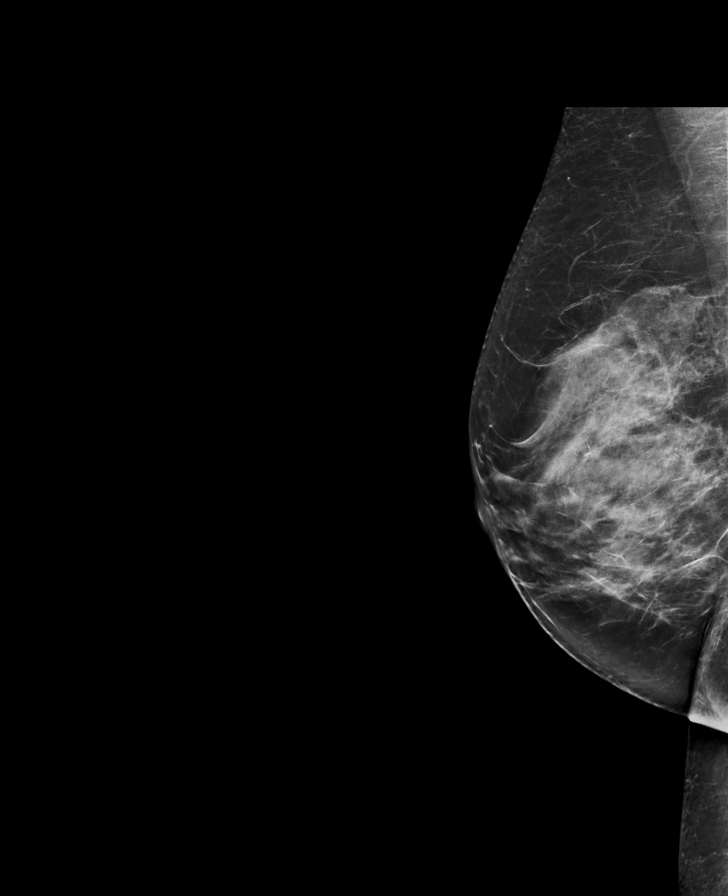

[L MLO synth-2D]
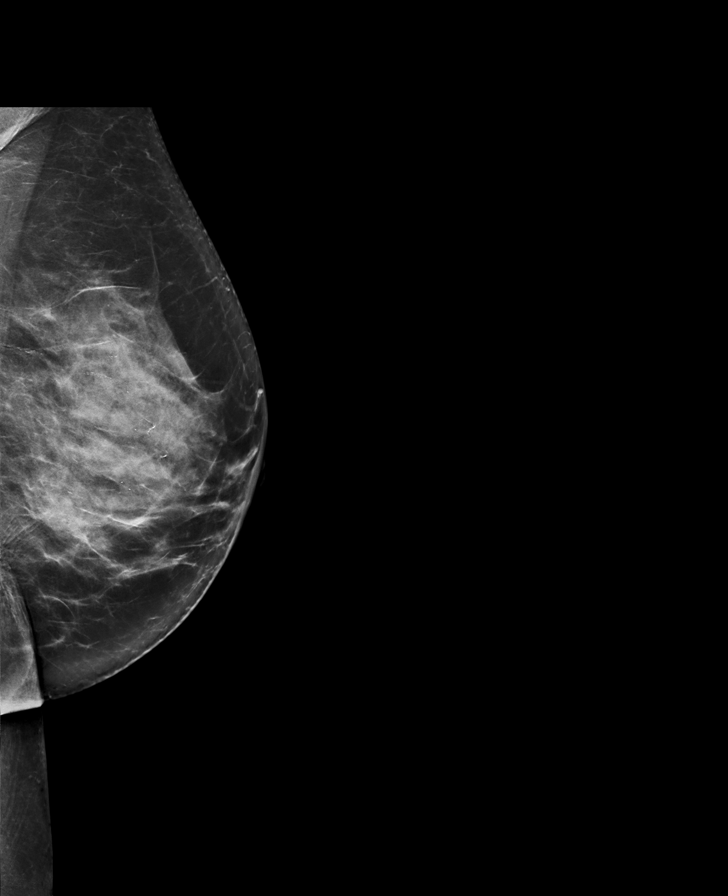

[L CC synth-2D]
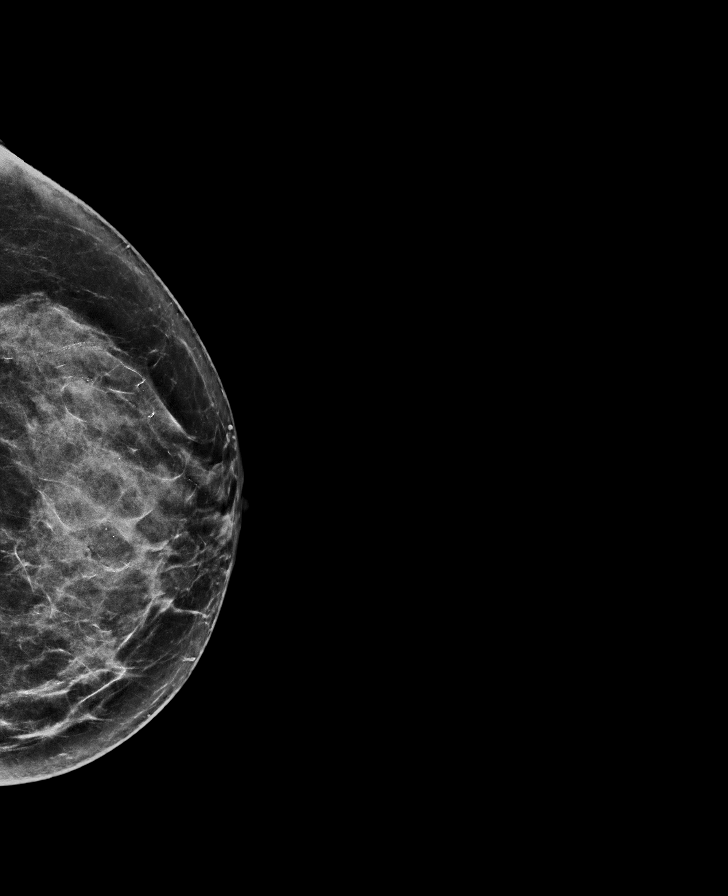

[R MLO tomo · tomo slice 40/79.0]
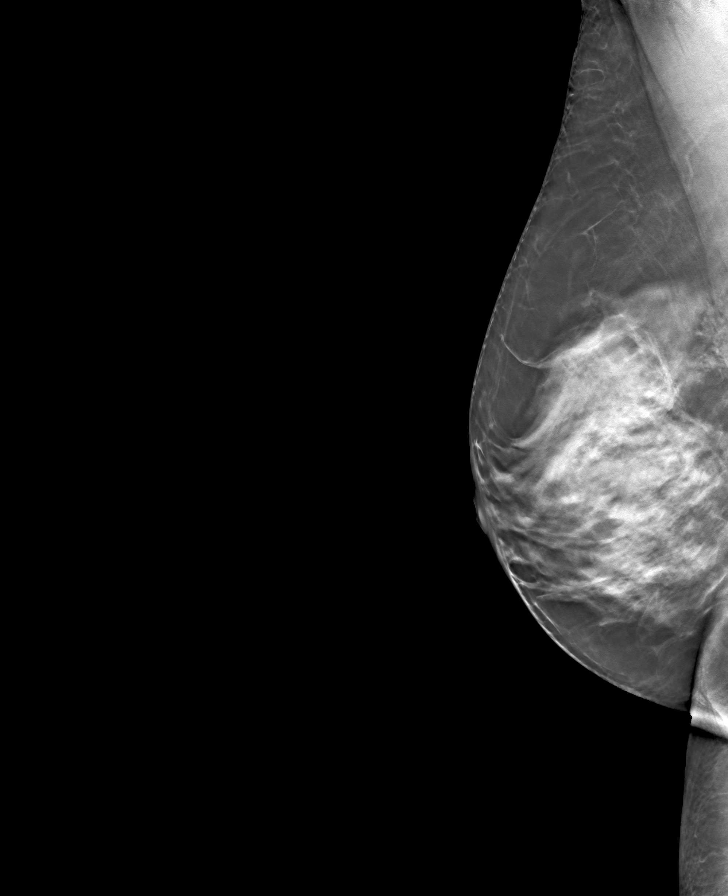

[L MLO tomo · tomo slice 42/83.0]
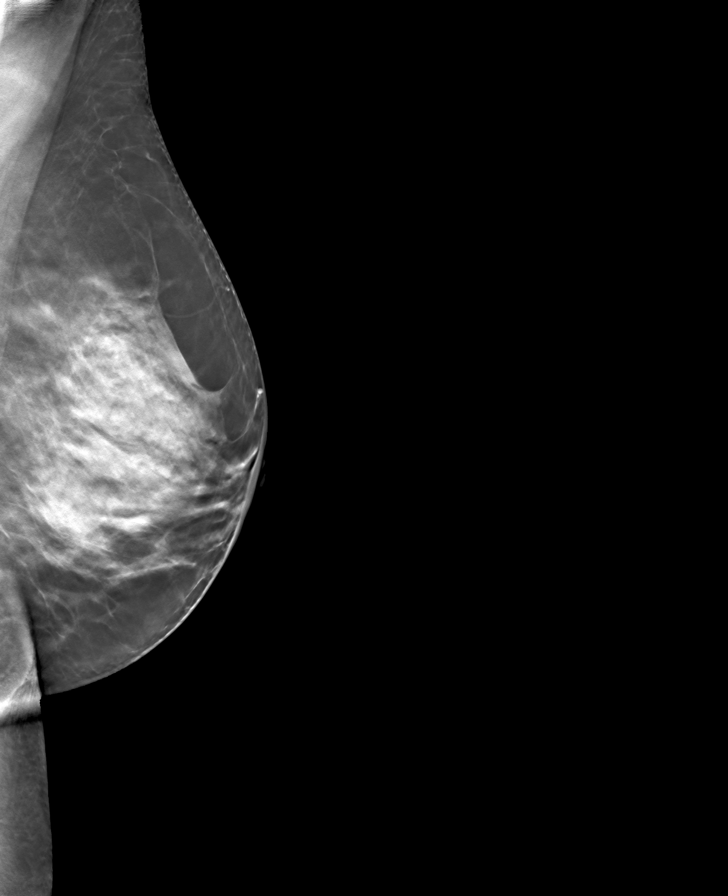

[L CC tomo · tomo slice 37/72.0]
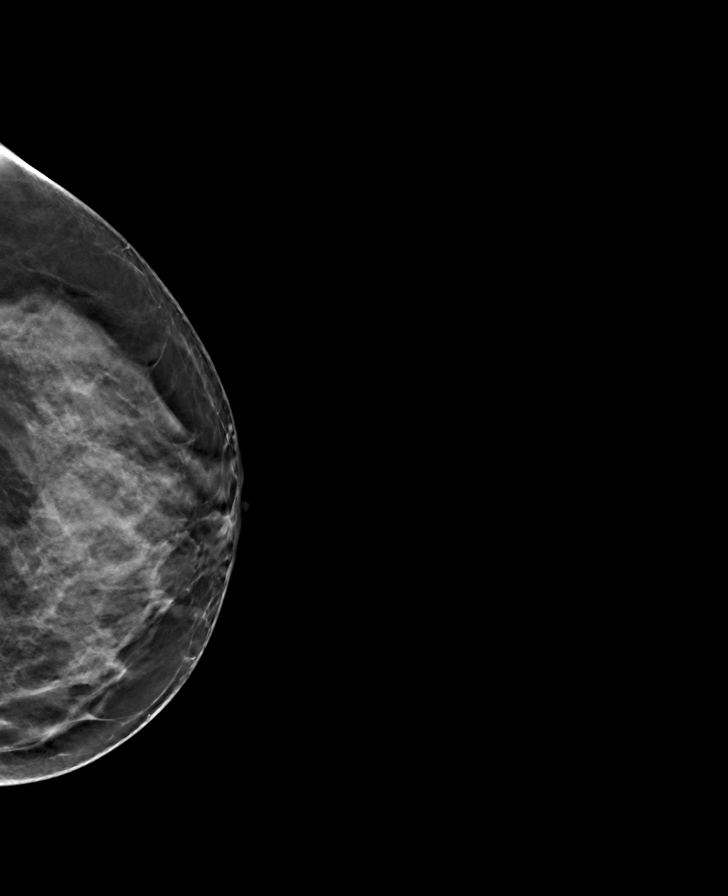

[R CC tomo · tomo slice 35/68.0]
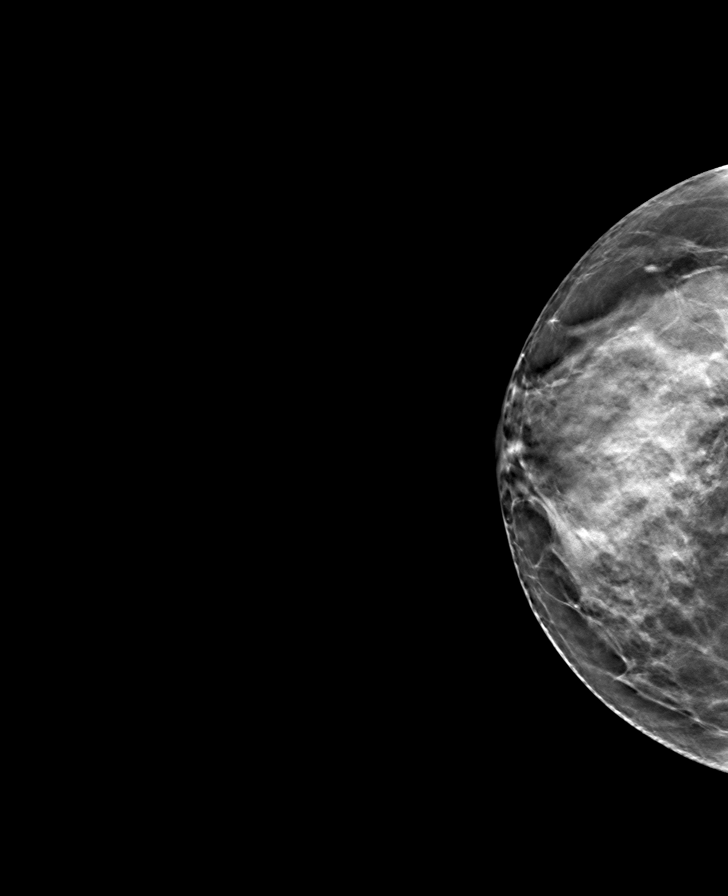

[8 of 24 positions shown; findings below may reference images not displayed]

ACR Breast Density Category c: The breast tissue is heterogeneously
dense, which may obscure small masses.
FINDINGS: There are no findings suspicious for malignancy. Images were
processed with CAD.
IMPRESSION: No mammographic evidence of malignancy. A result letter of this
screening mammogram will be mailed directly to the patient.

RECOMMENDATION:
Screening mammogram in one year. (Code:[id number])

BI-RADS CATEGORY  1: Negative.

## 2022-07-10 ENCOUNTER — Encounter: Payer: Self-pay | Admitting: *Deleted

## 2022-08-20 ENCOUNTER — Other Ambulatory Visit (HOSPITAL_COMMUNITY): Payer: Self-pay | Admitting: Family Medicine

## 2022-08-20 DIAGNOSIS — Z1231 Encounter for screening mammogram for malignant neoplasm of breast: Secondary | ICD-10-CM

## 2022-08-31 ENCOUNTER — Ambulatory Visit (HOSPITAL_COMMUNITY)
Admission: RE | Admit: 2022-08-31 | Discharge: 2022-08-31 | Disposition: A | Discharge status: Home / Self Care | Payer: Medicare HMO | Source: Ambulatory Visit | Attending: Family Medicine | Admitting: Family Medicine

## 2022-08-31 DIAGNOSIS — Z1231 Encounter for screening mammogram for malignant neoplasm of breast: Secondary | ICD-10-CM | POA: Diagnosis present

## 2022-09-26 ENCOUNTER — Other Ambulatory Visit (HOSPITAL_COMMUNITY): Payer: Self-pay | Admitting: Adult Health

## 2022-09-26 DIAGNOSIS — R928 Other abnormal and inconclusive findings on diagnostic imaging of breast: Secondary | ICD-10-CM

## 2022-10-02 ENCOUNTER — Ambulatory Visit (HOSPITAL_COMMUNITY)
Admission: RE | Admit: 2022-10-02 | Discharge: 2022-10-02 | Disposition: A | Discharge status: Home / Self Care | Payer: Medicare HMO | Source: Ambulatory Visit | Attending: Adult Health | Admitting: Adult Health

## 2022-10-02 DIAGNOSIS — R928 Other abnormal and inconclusive findings on diagnostic imaging of breast: Secondary | ICD-10-CM | POA: Insufficient documentation

## 2023-01-05 ENCOUNTER — Ambulatory Visit
Admission: EM | Admit: 2023-01-05 | Discharge: 2023-01-05 | Disposition: A | Discharge status: Home / Self Care | Payer: Medicare HMO | Attending: Family Medicine | Admitting: Family Medicine

## 2023-01-05 ENCOUNTER — Ambulatory Visit: Payer: Medicare HMO

## 2023-01-05 DIAGNOSIS — F17210 Nicotine dependence, cigarettes, uncomplicated: Secondary | ICD-10-CM | POA: Insufficient documentation

## 2023-01-05 DIAGNOSIS — J069 Acute upper respiratory infection, unspecified: Secondary | ICD-10-CM | POA: Insufficient documentation

## 2023-01-05 DIAGNOSIS — Z1152 Encounter for screening for COVID-19: Secondary | ICD-10-CM | POA: Diagnosis not present

## 2023-01-05 DIAGNOSIS — R062 Wheezing: Secondary | ICD-10-CM | POA: Diagnosis not present

## 2023-01-05 DIAGNOSIS — R0602 Shortness of breath: Secondary | ICD-10-CM

## 2023-01-05 DIAGNOSIS — B9789 Other viral agents as the cause of diseases classified elsewhere: Secondary | ICD-10-CM | POA: Diagnosis not present

## 2023-01-05 LAB — POCT RAPID STREP A (OFFICE): Rapid Strep A Screen: NEGATIVE

## 2023-01-05 MED ORDER — HYDROCODONE BIT-HOMATROP MBR 5-1.5 MG/5ML PO SOLN: 5.0000 mL | Freq: Four times a day (QID) | ORAL | 0 refills | Status: DC | PRN

## 2023-01-05 MED ORDER — PREDNISONE 20 MG PO TABS: 40.0000 mg | ORAL_TABLET | Freq: Every day | ORAL | 0 refills | Status: DC

## 2023-01-05 NOTE — Discharge Instructions (Signed)
Be aware, your cough medication may cause drowsiness. Please do not drive, operate heavy machinery or make important decisions while on this medication, it can cloud your judgement.  You have been tested for COVID-19 today. If your test returns positive, you will receive a phone call from [facility] regarding your results. Negative test results are not called. Both positive and negative results area always visible on MyChart. If you do not have a MyChart account, sign up instructions are provided in your discharge papers. Please do not hesitate to contact us should you have questions or concerns.  

## 2023-01-05 NOTE — ED Triage Notes (Signed)
Pt reports she has a cough, sore throat, and SOB x 2 days

## 2023-01-06 LAB — SARS CORONAVIRUS 2 (TAT 6-24 HRS): SARS Coronavirus 2: NEGATIVE

## 2023-01-09 NOTE — ED Provider Notes (Signed)
[facility]   [id number] 01/05/23 Arrival Time: 0901  ASSESSMENT & PLAN:  1. Viral URI with cough   2. Wheezing    Discussed typical duration of likely viral illness. Results for orders placed or performed during the hospital encounter of 01/05/23  SARS CORONAVIRUS 2 (TAT 6-24 HRS) Anterior Nasal Swab   Specimen: Anterior Nasal Swab  Result Value Ref Range   SARS Coronavirus 2 NEGATIVE NEGATIVE  POCT rapid strep A  Result Value Ref Range   Rapid Strep A Screen Negative Negative   I have personally viewed and independently interpreted the imaging studies ordered this visit. No acute changes on CXR.  OTC symptom care as needed.  Discharge Medication List as of 01/05/2023  3:07 PM     START taking these medications   Details  HYDROcodone bit-homatropine (HYCODAN) 5-1.5 MG/5ML syrup Take 5 mLs by mouth every 6 (six) hours as needed for cough., Starting Sat 01/05/2023, Normal    predniSONE (DELTASONE) 20 MG tablet Take 2 tablets (40 mg total) by mouth daily., Starting Sat 01/05/2023, Normal       [place] Controlled Substances Registry consulted for this patient. I feel the risk/benefit ratio today is favorable for proceeding with this prescription for a controlled substance. Medication sedation precautions given.    Follow-up Information     Oval Linsey, MD.   Specialty: Internal Medicine Why: If worsening or failing to improve as anticipated. Contact information: [address] [place] [address] [phone]                 Reviewed expectations re: course of current medical issues. Questions answered. Outlined signs and symptoms indicating need for more acute intervention. Understanding verbalized. After Visit Summary given.   SUBJECTIVE: History from: Patient. Cindy Buckley is a 72 y.o. female. Pt reports she has a cough, sore throat, and SOB x 2 days  Denies: fever. Normal PO intake without n/v/d.  OBJECTIVE:  Vitals:   01/05/23  1349  BP: (!) 169/79  Pulse: 87  Resp: 16  Temp: 98.7 F (37.1 C)  TempSrc: Oral  SpO2: 96%    General appearance: alert; no distress Eyes: PERRLA; EOMI; conjunctiva normal HENT: [place]; AT; with nasal congestion Neck: supple  Lungs: speaks full sentences without difficulty; bilateral exp wheezing Extremities: no edema Skin: warm and dry Neurologic: normal gait Psychological: alert and cooperative; normal mood and affect  Labs: Results for orders placed or performed during the hospital encounter of 01/05/23  SARS CORONAVIRUS 2 (TAT 6-24 HRS) Anterior Nasal Swab   Specimen: Anterior Nasal Swab  Result Value Ref Range   SARS Coronavirus 2 NEGATIVE NEGATIVE  POCT rapid strep A  Result Value Ref Range   Rapid Strep A Screen Negative Negative   Labs Reviewed  SARS CORONAVIRUS 2 (TAT 6-24 HRS)  POCT RAPID STREP A (OFFICE)    Imaging: DG Chest 2 View  Result Date: 01/05/2023 CLINICAL DATA:  Shortness of breath. EXAM: CHEST - 2 VIEW COMPARISON:  October 02, 2012 FINDINGS: Tortuosity of the aorta. Cardiomediastinal silhouette is normal. Mediastinal contours appear intact. There is no evidence of focal airspace consolidation, pleural effusion or pneumothorax. Osseous structures are without acute abnormality. Soft tissues are grossly normal. IMPRESSION: No active cardiopulmonary disease. Electronically Signed   By: Ted Mcalpine M.D.   On: 01/05/2023 15:43    No Known Allergies  Past Medical History:  Diagnosis Date   Hypercholesteremia    Social History   Socioeconomic History   Marital  status: Single    Spouse name: Not on file   Number of children: Not on file   Years of education: Not on file   Highest education level: Not on file  Occupational History   Not on file  Tobacco Use   Smoking status: Every Day    Current packs/day: 1.00    Average packs/day: 1 pack/day for 32.0 years (32.0 ttl pk-yrs)    Types: Cigarettes   Smokeless tobacco: Never  Substance and  Sexual Activity   Alcohol use: Yes    Comment: occasionally   Drug use: No   Sexual activity: Not on file  Other Topics Concern   Not on file  Social History Narrative   Not on file   Social Determinants of Health   Financial Resource Strain: Not on file  Food Insecurity: Not on file  Transportation Needs: Not on file  Physical Activity: Not on file  Stress: Not on file  Social Connections: Not on file  Intimate Partner Violence: Not on file   History reviewed. No pertinent family history. Past Surgical History:  Procedure Laterality Date   COLONOSCOPY N/A 07/25/2012   Procedure: COLONOSCOPY;  Surgeon: West Bali, MD;  Location: [facility];  Service: Endoscopy;  Laterality: N/A;  11:30 AM   TOOTH EXTRACTION       Mardella Layman, MD 01/09/23 1047

## 2023-07-16 ENCOUNTER — Other Ambulatory Visit (HOSPITAL_COMMUNITY): Payer: Self-pay | Admitting: Adult Health

## 2023-07-16 DIAGNOSIS — N6489 Other specified disorders of breast: Secondary | ICD-10-CM

## 2023-09-03 ENCOUNTER — Ambulatory Visit (HOSPITAL_COMMUNITY)
Admission: RE | Admit: 2023-09-03 | Discharge: 2023-09-03 | Disposition: A | Discharge status: Home / Self Care | Source: Ambulatory Visit | Attending: Adult Health | Admitting: Adult Health

## 2023-09-03 ENCOUNTER — Encounter (HOSPITAL_COMMUNITY): Payer: Self-pay

## 2023-09-03 ENCOUNTER — Ambulatory Visit (HOSPITAL_COMMUNITY)
Admission: RE | Admit: 2023-09-03 | Discharge: 2023-09-03 | Disposition: A | Discharge status: Home / Self Care | Source: Ambulatory Visit | Attending: Adult Health

## 2023-09-03 DIAGNOSIS — N6489 Other specified disorders of breast: Secondary | ICD-10-CM

## 2024-02-18 ENCOUNTER — Encounter (INDEPENDENT_AMBULATORY_CARE_PROVIDER_SITE_OTHER): Payer: Self-pay | Admitting: *Deleted

## 2024-03-02 ENCOUNTER — Telehealth: Payer: Self-pay

## 2024-03-02 NOTE — Telephone Encounter (Signed)
 Who is your primary care physician: Cindy Buckley  Reasons for the colonoscopy: screening  Have you had a colonoscopy before?  Yes 07-25-2012  Do you have family history of colon cancer? no  Previous colonoscopy with polyps removed? no  Do you have a history colorectal cancer?   no  Are you diabetic? If yes, Type 1 or Type 2?    no  Do you have a prosthetic or mechanical heart valve? no  Do you have a pacemaker/defibrillator?   no  Have you had endocarditis/atrial fibrillation? no  Have you had joint replacement within the last 12 months?  no  Do you tend to be constipated or have to use laxatives? no  Do you have any history of drugs or alchohol?  no  Do you use supplemental oxygen?  no  Have you had a stroke or heart attack within the last 6 months? no  Do you take weight loss medication?  no  For female patients: have you had a hysterectomy?  yes                                     are you post menopausal?       yes                                            do you still have your menstrual cycle? no      Do you take any blood-thinning medications such as: (aspirin, warfarin, Plavix, Aggrenox)  yes ASA 81 mg  If yes we need the name, milligram, dosage and who is prescribing doctor  Current Outpatient Medications on File Prior to Visit  Medication Sig Dispense Refill   albuterol  (PROVENTIL  HFA;VENTOLIN  HFA) 108 (90 BASE) MCG/ACT inhaler Inhale 2 puffs into the lungs every 4 (four) hours as needed for wheezing. 1 Inhaler 0   alendronate (FOSAMAX) 5 MG tablet Take 5 mg by mouth daily before breakfast. Take with a full glass of water  on an empty stomach.     aspirin EC 81 MG tablet Take 81 mg by mouth daily. Swallow whole.     benzonatate (TESSALON) 200 MG capsule 200 mg.     cetirizine (ZYRTEC) 10 MG chewable tablet 10 mg.     fluticasone furoate-vilanterol (BREO ELLIPTA) 200-25 MCG/ACT AEPB Inhale 1 puff into the lungs daily.     latanoprost (XALATAN) 0.005 % ophthalmic  solution 1 drop at bedtime.     methimazole (TAPAZOLE) 5 MG tablet Take 5 mg by mouth 3 (three) times daily.     pravastatin (PRAVACHOL) 40 MG tablet Take 40 mg by mouth daily.     timolol (BETIMOL) 0.25 % ophthalmic solution 1-2 drops 2 (two) times daily.     torsemide (DEMADEX) 5 MG tablet Take 5 mg by mouth daily.     No current facility-administered medications on file prior to visit.    No Known Allergies   Pharmacy: [facility] or [facility]  Primary Insurance Name: Cindy Buckley [id number] and Medicare [id number] [phone]  Best number where you can be reached: [phone]

## 2024-03-23 NOTE — Telephone Encounter (Signed)
 Pt called to check on questionnaire. She says she has a family member that may need surgery and she may have to go out of town. She wants to get this done sooner then later.

## 2024-04-03 NOTE — Telephone Encounter (Signed)
 Appropriate. ASA 2. Prior [place] patient.

## 2024-04-06 MED ORDER — CLENPIQ 10-3.5-12 MG-GM -GM/175ML PO SOLN: 1.0000 | ORAL | Status: DC

## 2024-04-06 NOTE — Telephone Encounter (Signed)
 Spoke with pt. She has been scheduled for 12/16. She reports unable to drink large volume as this made her very sick. Advised will leave prep sample and instructions for pick up at front desk.  Medication Samples have been provided to the patient.  Drug name: CLENPIQ        Strength: 10MG /3.5G/12G PER BOTTLE        Qty:  LOT: [id number]  Exp.Date: 02/27/25  Dosing instructions: FOR COLONOSCOPY PREP  The patient has been instructed regarding the correct time, dose, and frequency of taking this medication, including desired effects and most common side effects.

## 2024-04-06 NOTE — Addendum Note (Signed)
 Addended by: JEANELL GRAEME RAMAN on: 04/06/2024 08:41 AM   Modules accepted: Orders

## 2024-04-09 ENCOUNTER — Encounter (INDEPENDENT_AMBULATORY_CARE_PROVIDER_SITE_OTHER): Payer: Self-pay | Admitting: *Deleted

## 2024-04-09 ENCOUNTER — Other Ambulatory Visit: Payer: Self-pay

## 2024-04-09 ENCOUNTER — Encounter (HOSPITAL_COMMUNITY): Payer: Self-pay

## 2024-04-09 ENCOUNTER — Encounter (HOSPITAL_COMMUNITY)
Admission: RE | Admit: 2024-04-09 | Discharge: 2024-04-09 | Disposition: A | Discharge status: Home / Self Care | Source: Ambulatory Visit | Attending: Internal Medicine | Admitting: Internal Medicine

## 2024-04-09 NOTE — Telephone Encounter (Signed)
 Referral completed, [facility] apt letter sent to PCP

## 2024-04-14 ENCOUNTER — Ambulatory Visit (HOSPITAL_COMMUNITY): Admitting: Anesthesiology

## 2024-04-14 ENCOUNTER — Encounter (HOSPITAL_COMMUNITY): Admission: RE | Disposition: A | Payer: Self-pay | Attending: Internal Medicine

## 2024-04-14 ENCOUNTER — Ambulatory Visit (HOSPITAL_COMMUNITY)
Admission: RE | Admit: 2024-04-14 | Discharge: 2024-04-14 | Disposition: A | Attending: Internal Medicine | Admitting: Internal Medicine

## 2024-04-14 ENCOUNTER — Encounter (HOSPITAL_COMMUNITY): Payer: Self-pay | Admitting: Internal Medicine

## 2024-04-14 DIAGNOSIS — Z1211 Encounter for screening for malignant neoplasm of colon: Secondary | ICD-10-CM | POA: Diagnosis present

## 2024-04-14 DIAGNOSIS — F17200 Nicotine dependence, unspecified, uncomplicated: Secondary | ICD-10-CM | POA: Insufficient documentation

## 2024-04-14 DIAGNOSIS — D123 Benign neoplasm of transverse colon: Secondary | ICD-10-CM | POA: Diagnosis not present

## 2024-04-14 DIAGNOSIS — K648 Other hemorrhoids: Secondary | ICD-10-CM

## 2024-04-14 DIAGNOSIS — I10 Essential (primary) hypertension: Secondary | ICD-10-CM | POA: Diagnosis not present

## 2024-04-14 DIAGNOSIS — K635 Polyp of colon: Secondary | ICD-10-CM | POA: Diagnosis not present

## 2024-04-14 DIAGNOSIS — F17210 Nicotine dependence, cigarettes, uncomplicated: Secondary | ICD-10-CM | POA: Diagnosis not present

## 2024-04-14 HISTORY — PX: COLONOSCOPY: SHX5424

## 2024-04-14 HISTORY — PX: POLYPECTOMY: SHX149

## 2024-04-14 SURGERY — COLONOSCOPY
Anesthesia: Monitor Anesthesia Care

## 2024-04-14 MED ORDER — PROPOFOL 10 MG/ML IV BOLUS
INTRAVENOUS | Status: AC
  Filled 2024-04-14: qty 20

## 2024-04-14 MED ORDER — PROPOFOL 10 MG/ML IV BOLUS
INTRAVENOUS | Status: DC | PRN
  Administered 2024-04-14: 12:00:00 50 mg via INTRAVENOUS
  Administered 2024-04-14 (×2): 100 mg via INTRAVENOUS
  Administered 2024-04-14: 12:00:00 50 mg via INTRAVENOUS

## 2024-04-14 MED ORDER — LACTATED RINGERS IV SOLN: INTRAVENOUS | Status: DC

## 2024-04-14 NOTE — Transfer of Care (Signed)
 Immediate Anesthesia Transfer of Care Note  Patient: Cindy Buckley  Procedure(s) Performed: COLONOSCOPY POLYPECTOMY, INTESTINE  Patient Location: PACU  Anesthesia Type:MAC  Level of Consciousness: drowsy and patient cooperative  Airway & Oxygen Therapy: Patient Spontanous Breathing  Post-op Assessment: Report given to RN and Post -op Vital signs reviewed and stable  Post vital signs: Reviewed and stable  Last Vitals:  Vitals Value Taken Time  BP    Temp    Pulse    Resp    SpO2      Last Pain:  Vitals:   04/14/24 1130  PainSc: 0-No pain         Complications: No notable events documented.

## 2024-04-14 NOTE — Anesthesia Preprocedure Evaluation (Signed)
 Anesthesia Evaluation  Patient identified by MRN, date of birth, ID band Patient awake    Reviewed: Allergy & Precautions, H&P , NPO status , Patient's Chart, lab work & pertinent test results, reviewed documented beta blocker date and time   Airway Mallampati: II  TM Distance: >3 FB Neck ROM: full    Dental no notable dental hx.    Pulmonary neg pulmonary ROS, Current Smoker   Pulmonary exam normal breath sounds clear to auscultation       Cardiovascular Exercise Tolerance: Good hypertension, negative cardio ROS  Rhythm:regular Rate:Normal     Neuro/Psych negative neurological ROS  negative psych ROS   GI/Hepatic negative GI ROS, Neg liver ROS,,,  Endo/Other  negative endocrine ROS    Renal/GU negative Renal ROS  negative genitourinary   Musculoskeletal   Abdominal   Peds  Hematology negative hematology ROS (+)   Anesthesia Other Findings   Reproductive/Obstetrics negative OB ROS                              Anesthesia Physical Anesthesia Plan  ASA: 2  Anesthesia Plan: MAC   Post-op Pain Management:    Induction:   PONV Risk Score and Plan: Propofol  infusion  Airway Management Planned:   Additional Equipment:   Intra-op Plan:   Post-operative Plan:   Informed Consent: I have reviewed the patients History and Physical, chart, labs and discussed the procedure including the risks, benefits and alternatives for the proposed anesthesia with the patient or authorized representative who has indicated his/her understanding and acceptance.     Dental Advisory Given  Plan Discussed with: CRNA  Anesthesia Plan Comments:         Anesthesia Quick Evaluation

## 2024-04-14 NOTE — Discharge Instructions (Addendum)
°  Colonoscopy Discharge Instructions  Read the instructions outlined below and refer to this sheet in the next few weeks. These discharge instructions provide you with general information on caring for yourself after you leave the hospital. Your doctor may also give you specific instructions. While your treatment has been planned according to the most current medical practices available, unavoidable complications occasionally occur.   ACTIVITY You may resume your regular activity, but move at a slower pace for the next 24 hours.  Take frequent rest periods for the next 24 hours.  Walking will help get rid of the air and reduce the bloated feeling in your belly (abdomen).  No driving for 24 hours (because of the medicine (anesthesia) used during the test).   Do not sign any important legal documents or operate any machinery for 24 hours (because of the anesthesia used during the test).  NUTRITION Drink plenty of fluids.  You may resume your normal diet as instructed by your doctor.  Begin with a light meal and progress to your normal diet. Heavy or fried foods are harder to digest and may make you feel sick to your stomach (nauseated).  Avoid alcoholic beverages for 24 hours or as instructed.  MEDICATIONS You may resume your normal medications unless your doctor tells you otherwise.  WHAT YOU CAN EXPECT TODAY Some feelings of bloating in the abdomen.  Passage of more gas than usual.  Spotting of blood in your stool or on the toilet paper.  IF YOU HAD POLYPS REMOVED DURING THE COLONOSCOPY: No aspirin products for 7 days or as instructed.  No alcohol for 7 days or as instructed.  Eat a soft diet for the next 24 hours.  FINDING OUT THE RESULTS OF YOUR TEST Not all test results are available during your visit. If your test results are not back during the visit, make an appointment with your caregiver to find out the results. Do not assume everything is normal if you have not heard from your  caregiver or the medical facility. It is important for you to follow up on all of your test results.  SEEK IMMEDIATE MEDICAL ATTENTION IF: You have more than a spotting of blood in your stool.  Your belly is swollen (abdominal distention).  You are nauseated or vomiting.  You have a temperature over 101.  You have abdominal pain or discomfort that is severe or gets worse throughout the day.   Your colonoscopy revealed 8 polyp(s) which I removed successfully. Await pathology results, my office will contact you. I recommend repeating colonoscopy in 3 years for surveillance purposes, depending on pathology results.  Otherwise follow up with GI as needed.     I hope you have a great rest of your week!  Carlin POUR. Cindie, D.O. Gastroenterology and Hepatology [facility]

## 2024-04-14 NOTE — H&P (Signed)
 Primary Care Physician:  Benjamin Raina Elizabeth, NP Primary Gastroenterologist:  Dr. Cindie  Pre-Procedure History & Physical: HPI:  Cindy Buckley is a 73 y.o. female is here for a colonoscopy for colon cancer screening purposes.  Patient denies any family history of colorectal cancer.   Past Medical History:  Diagnosis Date   Hypercholesteremia     Past Surgical History:  Procedure Laterality Date   COLONOSCOPY N/A 07/25/2012   Procedure: COLONOSCOPY;  Surgeon: Margo LITTIE Haddock, MD;  Location: [facility];  Service: Endoscopy;  Laterality: N/A;  11:30 AM   TOOTH EXTRACTION      Prior to Admission medications  Medication Sig Start Date End Date Taking? Authorizing Provider  albuterol  (PROVENTIL  HFA;VENTOLIN  HFA) 108 (90 BASE) MCG/ACT inhaler Inhale 2 puffs into the lungs every 4 (four) hours as needed for wheezing. 10/02/12  Yes Knapp, Iva, MD  alendronate (FOSAMAX) 5 MG tablet Take 5 mg by mouth daily before breakfast. Take with a full glass of water  on an empty stomach.   Yes [provider]  aspirin EC 81 MG tablet Take 81 mg by mouth daily. Swallow whole.   Yes [provider]  benzonatate (TESSALON) 200 MG capsule 200 mg.   Yes [provider]  cetirizine (ZYRTEC) 10 MG chewable tablet 10 mg.   Yes [provider]  fluticasone furoate-vilanterol (BREO ELLIPTA) 200-25 MCG/ACT AEPB Inhale 1 puff into the lungs daily.   Yes [provider]  latanoprost (XALATAN) 0.005 % ophthalmic solution 1 drop at bedtime.   Yes [provider]  methimazole (TAPAZOLE) 5 MG tablet Take 5 mg by mouth 3 (three) times daily.   Yes [provider]  pravastatin (PRAVACHOL) 40 MG tablet Take 40 mg by mouth daily.   Yes [provider]  Sod Picosulfate-Mag Ox-Cit Acd (CLENPIQ ) 10-3.5-12 MG-GM -GM/175ML SOLN Take 1 kit by mouth as directed. 04/06/24  Yes Donne Baley K, DO  timolol (BETIMOL) 0.25 % ophthalmic solution 1-2 drops 2  (two) times daily.   Yes [provider]  torsemide (DEMADEX) 5 MG tablet Take 5 mg by mouth daily.   Yes [provider]    Allergies as of 04/06/2024   (No Known Allergies)    History reviewed. No pertinent family history.  Social History   Socioeconomic History   Marital status: Single    Spouse name: Not on file   Number of children: Not on file   Years of education: Not on file   Highest education level: Not on file  Occupational History   Not on file  Tobacco Use   Smoking status: Every Day    Current packs/day: 1.00    Average packs/day: 1 pack/day for 32.0 years (32.0 ttl pk-yrs)    Types: Cigarettes   Smokeless tobacco: Never  Vaping Use   Vaping status: Never Used  Substance and Sexual Activity   Alcohol use: Yes    Comment: occasionally   Drug use: No   Sexual activity: Not on file  Other Topics Concern   Not on file  Social History Narrative   Not on file   Social Drivers of Health   Tobacco Use: High Risk (04/14/2024)   Patient History    Smoking Tobacco Use: Every Day    Smokeless Tobacco Use: Never    Passive Exposure: Not on file  Financial Resource Strain: Not on file  Food Insecurity: Not on file  Transportation Needs: Not on file  Physical Activity: Not on file  Stress: Not on file  Social Connections: Not on file  Intimate Partner Violence: Not on file  Depression (PHQ2-9): Not on file  Alcohol Screen: Not on file  Housing: Not on file  Utilities: Not on file  Health Literacy: Not on file    Review of Systems: See HPI, otherwise negative ROS  Physical Exam: Vital signs in last 24 hours: Temp:  [98.6 F (37 C)] 98.6 F (37 C) (12/16 1017) Resp:  [18] 18 (12/16 1017) BP: (108)/(54) 108/54 (12/16 1017) SpO2:  [94 %] 94 % (12/16 1017)   General:   Alert,  Well-developed, well-nourished, pleasant and cooperative in NAD Head:  Normocephalic and atraumatic. Eyes:  Sclera clear, no icterus.   Conjunctiva  pink. Ears:  Normal auditory acuity. Nose:  No deformity, discharge,  or lesions. Msk:  Symmetrical without gross deformities. Normal posture. Extremities:  Without clubbing or edema. Neurologic:  Alert and  oriented x4;  grossly normal neurologically. Skin:  Intact without significant lesions or rashes. Psych:  Alert and cooperative. Normal mood and affect.  Impression/Plan: Cindy Buckley is here for a colonoscopy to be performed for colon cancer screening purposes.  The risks of the procedure including infection, bleed, or perforation as well as benefits, limitations, alternatives and imponderables have been reviewed with the patient. Questions have been answered. All parties agreeable.

## 2024-04-14 NOTE — Op Note (Signed)
 [facility] Patient Name: Cindy Buckley Procedure Date: 04/14/2024 11:21 AM MRN: [id number] Date of Birth: 09-Mar-1951 Attending MD: Carlin POUR. Cindie , [place], [id number] CSN: [id number] Age: 73 Admit Type: Outpatient Procedure:                Colonoscopy Indications:              Screening for colorectal malignant neoplasm Providers:                Carlin POUR. Cindie, DO, Devere Lodge, Bascom Blush Referring MD:              Medicines:                See the Anesthesia note for documentation of the                            administered medications Complications:            No immediate complications. Estimated Blood Loss:     Estimated blood loss was minimal. Procedure:                Pre-Anesthesia Assessment:                           - The anesthesia plan was to use monitored                            anesthesia care (MAC).                           After obtaining informed consent, the colonoscope                            was passed under direct vision. Throughout the                            procedure, the patient's blood pressure, pulse, and                            oxygen saturations were monitored continuously. The                            PCF-HQ190L ([id number]) Peds Colon was introduced                            through the anus and advanced to the the cecum,                            identified by appendiceal orifice and ileocecal                            valve. The colonoscopy was performed without                            difficulty. The patient tolerated the procedure                            well. The  quality of the bowel preparation was                            evaluated using the BBPS [facility] Bowel Preparation                            Scale) with scores of: Right Colon = 3 (entire                            mucosa seen well with no residual staining, small                            fragments of stool or opaque liquid), Transverse                             Colon = 2 (minor amount of residual staining, small                            fragments of stool and/or opaque liquid, but mucosa                            seen well) and Left Colon = 2 (minor amount of                            residual staining, small fragments of stool and/or                            opaque liquid, but mucosa seen well). The total                            BBPS score equals 7. The quality of the bowel                            preparation was good. Scope In: 11:33:39 AM Scope Out: 11:55:34 AM Scope Withdrawal Time: 0 hours 17 minutes 30 seconds  Total Procedure Duration: 0 hours 21 minutes 55 seconds  Findings:      Non-bleeding internal hemorrhoids were found.      Eight sessile polyps were found in the transverse colon. The polyps were       4 to 10 mm in size. These polyps were removed with a cold snare.       Resection and retrieval were complete.      The exam was otherwise without abnormality. Impression:               - Non-bleeding internal hemorrhoids.                           - Eight 4 to 10 mm polyps in the transverse colon,                            removed with a cold snare. Resected and retrieved.                           -  The examination was otherwise normal. Moderate Sedation:      Per Anesthesia Care Recommendation:           - Patient has a contact number available for                            emergencies. The signs and symptoms of potential                            delayed complications were discussed with the                            patient. Return to normal activities tomorrow.                            Written discharge instructions were provided to the                            patient.                           - Resume previous diet.                           - Continue present medications.                           - Await pathology results.                           - Repeat colonoscopy in 3 years for  surveillance.                           - Return to GI clinic PRN. Procedure Code(s):        --- Professional ---                           [phone], Colonoscopy, flexible; with removal of                            tumor(s), polyp(s), or other lesion(s) by snare                            technique Diagnosis Code(s):        --- Professional ---                           Z12.11, Encounter for screening for malignant                            neoplasm of colon                           D12.3, Benign neoplasm of transverse colon (hepatic                            flexure or splenic flexure)  K64.8, Other hemorrhoids CPT copyright 2022 [employer]. All rights reserved. The codes documented in this report are preliminary and upon coder review may  be revised to meet current compliance requirements. Carlin POUR. Cindie, DO Carlin POUR. Cindie, DO 04/14/2024 11:59:03 AM This report has been signed electronically. Number of Addenda: 0

## 2024-04-15 LAB — SURGICAL PATHOLOGY

## 2024-04-16 ENCOUNTER — Encounter (HOSPITAL_COMMUNITY): Payer: Self-pay | Admitting: Internal Medicine

## 2024-04-17 NOTE — Anesthesia Postprocedure Evaluation (Signed)
"   Anesthesia Post Note  Patient: Cindy Buckley  Procedure(s) Performed: COLONOSCOPY POLYPECTOMY, INTESTINE  Patient location during evaluation: Phase II Anesthesia Type: MAC Level of consciousness: awake Pain management: pain level controlled Vital Signs Assessment: post-procedure vital signs reviewed and stable Respiratory status: spontaneous breathing and respiratory function stable Cardiovascular status: blood pressure returned to baseline and stable Postop Assessment: no headache and no apparent nausea or vomiting Anesthetic complications: no Comments: Late entry   No notable events documented.   Last Vitals:  Vitals:   04/14/24 1017 04/14/24 1159  BP: (!) 108/54 (!) 117/45  Pulse:  78  Resp: 18 20  Temp: 37 C 36.9 C  SpO2: 94% 100%    Last Pain:  Vitals:   04/15/24 1104  TempSrc:   PainSc: 0-No pain                 Yvonna PARAS Deryl Giroux      "
